# Patient Record
Sex: Female | Born: 1990 | Race: White | Hispanic: No | Marital: Single | Smoking: Never smoker
Health system: Southern US, Community
[De-identification: ages and names within clinical notes are randomized; demographics above are authoritative.]

---

## 2012-08-16 ENCOUNTER — Other Ambulatory Visit (HOSPITAL_COMMUNITY)
Admission: RE | Admit: 2012-08-16 | Discharge: 2012-08-16 | Disposition: A | Discharge status: Home / Self Care | Payer: BC Managed Care – PPO | Source: Ambulatory Visit | Attending: Obstetrics and Gynecology | Admitting: Obstetrics and Gynecology

## 2012-08-16 DIAGNOSIS — Z01419 Encounter for gynecological examination (general) (routine) without abnormal findings: Secondary | ICD-10-CM | POA: Insufficient documentation

## 2012-08-16 DIAGNOSIS — Z113 Encounter for screening for infections with a predominantly sexual mode of transmission: Secondary | ICD-10-CM | POA: Insufficient documentation

## 2013-06-18 ENCOUNTER — Encounter (HOSPITAL_COMMUNITY): Payer: Self-pay | Admitting: Emergency Medicine

## 2013-06-18 ENCOUNTER — Emergency Department (INDEPENDENT_AMBULATORY_CARE_PROVIDER_SITE_OTHER): Payer: BC Managed Care – PPO

## 2013-06-18 ENCOUNTER — Emergency Department (HOSPITAL_COMMUNITY)
Admission: EM | Admit: 2013-06-18 | Discharge: 2013-06-18 | Disposition: A | Discharge status: Home / Self Care | Payer: BC Managed Care – PPO | Source: Home / Self Care | Attending: Family Medicine | Admitting: Family Medicine

## 2013-06-18 DIAGNOSIS — R071 Chest pain on breathing: Secondary | ICD-10-CM

## 2013-06-18 DIAGNOSIS — R0789 Other chest pain: Secondary | ICD-10-CM

## 2013-06-18 MED ORDER — DICLOFENAC POTASSIUM 50 MG PO TABS: 50.0000 mg | ORAL_TABLET | Freq: Three times a day (TID) | ORAL | Status: DC

## 2013-06-18 NOTE — ED Provider Notes (Signed)
CSN: [id number]     Arrival date & time 06/18/13  1048 History   First MD Initiated Contact with Patient 06/18/13 1059     Chief Complaint  Patient presents with  . Chest Pain    since saturday.    (Consider location/radiation/quality/duration/timing/severity/associated sxs/prior Treatment) Patient is a 22 y.o. female presenting with chest pain. The history is provided by the patient.  Chest Pain Pain location:  R chest Pain quality: sharp   Pain radiates to:  Does not radiate Pain radiates to the back: no   Pain severity:  Mild Onset quality:  Gradual Duration:  4 days Progression:  Unchanged Chronicity:  New Associated symptoms: dizziness and shortness of breath   Associated symptoms: no abdominal pain, no cough, no fever, no heartburn and no palpitations     History reviewed. No pertinent past medical history. History reviewed. No pertinent past surgical history. History reviewed. No pertinent family history. History  Substance Use Topics  . Smoking status: Never Smoker   . Smokeless tobacco: Not on file  . Alcohol Use: Yes   OB History   Grav Para Term Preterm Abortions TAB SAB Ect Mult Living                 Review of Systems  Constitutional: Negative.  Negative for fever.  HENT: Negative.   Respiratory: Positive for shortness of breath. Negative for cough.   Cardiovascular: Positive for chest pain. Negative for palpitations and leg swelling.  Gastrointestinal: Negative.  Negative for heartburn and abdominal pain.  Genitourinary: Negative.   Neurological: Positive for dizziness.    Allergies  Sulfa antibiotics  Home Medications   Current Outpatient Rx  Name  Route  Sig  Dispense  Refill  . diclofenac (CATAFLAM) 50 MG tablet   Oral   Take 1 tablet (50 mg total) by mouth 3 (three) times daily. For chest pain   20 tablet   0    BP 143/93  Pulse 84  Temp(Src) 98.5 F (36.9 C) (Oral)  Resp 16  SpO2 96%  LMP 06/17/2013 Physical Exam  Nursing note  and vitals reviewed. Constitutional: She is oriented to person, place, and time. She appears well-developed and well-nourished.  HENT:  Head: Normocephalic.  Right Ear: External ear normal.  Left Ear: External ear normal.  Mouth/Throat: Oropharynx is clear and moist.  Eyes: Pupils are equal, round, and reactive to light.  Neck: Normal range of motion. Neck supple.  Cardiovascular: Normal rate, regular rhythm, normal heart sounds and intact distal pulses.   Pulmonary/Chest: Effort normal and breath sounds normal. She has no wheezes. She exhibits tenderness.  Neurological: She is alert and oriented to person, place, and time.  Skin: Skin is warm and dry.    ED Course  Procedures (including critical care time) Labs Review Labs Reviewed - No data to display Imaging Review Dg Chest 2 View  06/18/2013   CLINICAL DATA:  Chest pain for 3 days  EXAM: CHEST  2 VIEW  COMPARISON:  None.  FINDINGS: The heart size and mediastinal contours are within normal limits. Both lungs are clear. The visualized skeletal structures are unremarkable.  IMPRESSION: No active cardiopulmonary disease.   Electronically Signed   By: Elige Ko   On: 06/18/2013 11:38    EKG Interpretation     Ventricular Rate:    PR Interval:    QRS Duration:   QT Interval:    QTC Calculation:   R Axis:     Text Interpretation:  MDM  X-rays reviewed and report per radiologist. ecg -wnl.     Linna Hoff, MD 06/18/13 [phone]

## 2013-06-18 NOTE — ED Notes (Signed)
C/o chest pain with chest congestion since Saturday.  Sharp right sided pain that started yesterday. Gradually getting worse.  Dizziness and sob.  Pt has used mucinex with no relief.  Denies any other symptoms.

## 2013-09-23 ENCOUNTER — Encounter (HOSPITAL_COMMUNITY): Payer: Self-pay | Admitting: Emergency Medicine

## 2013-09-23 ENCOUNTER — Emergency Department (HOSPITAL_COMMUNITY)
Admission: EM | Admit: 2013-09-23 | Discharge: 2013-09-23 | Disposition: A | Discharge status: Home / Self Care | Payer: BC Managed Care – PPO | Source: Home / Self Care | Attending: Emergency Medicine | Admitting: Emergency Medicine

## 2013-09-23 DIAGNOSIS — A088 Other specified intestinal infections: Secondary | ICD-10-CM

## 2013-09-23 DIAGNOSIS — E860 Dehydration: Secondary | ICD-10-CM

## 2013-09-23 DIAGNOSIS — A084 Viral intestinal infection, unspecified: Secondary | ICD-10-CM

## 2013-09-23 LAB — CBC WITH DIFFERENTIAL/PLATELET
Basophils Absolute: 0 10*3/uL (ref 0.0–0.1)
Basophils Relative: 0 % (ref 0–1)
EOS PCT: 0 % (ref 0–5)
Eosinophils Absolute: 0 10*3/uL (ref 0.0–0.7)
HCT: 41.6 % (ref 36.0–46.0)
Hemoglobin: 14.5 g/dL (ref 12.0–15.0)
LYMPHS PCT: 20 % (ref 12–46)
Lymphs Abs: 1.4 10*3/uL (ref 0.7–4.0)
MCH: 27.7 pg (ref 26.0–34.0)
MCHC: 34.9 g/dL (ref 30.0–36.0)
MCV: 79.4 fL (ref 78.0–100.0)
Monocytes Absolute: 0.4 10*3/uL (ref 0.1–1.0)
Monocytes Relative: 6 % (ref 3–12)
NEUTROS ABS: 5 10*3/uL (ref 1.7–7.7)
Neutrophils Relative %: 73 % (ref 43–77)
PLATELETS: 258 10*3/uL (ref 150–400)
RBC: 5.24 MIL/uL — AB (ref 3.87–5.11)
RDW: 13.6 % (ref 11.5–15.5)
WBC: 6.8 10*3/uL (ref 4.0–10.5)

## 2013-09-23 LAB — POCT I-STAT, CHEM 8
BUN: 13 mg/dL (ref 6–23)
CHLORIDE: 100 meq/L (ref 96–112)
CREATININE: 1 mg/dL (ref 0.50–1.10)
Calcium, Ion: 1.21 mmol/L (ref 1.12–1.23)
Glucose, Bld: 96 mg/dL (ref 70–99)
HCT: 47 % — ABNORMAL HIGH (ref 36.0–46.0)
Hemoglobin: 16 g/dL — ABNORMAL HIGH (ref 12.0–15.0)
Potassium: 3.9 mEq/L (ref 3.7–5.3)
SODIUM: 137 meq/L (ref 137–147)
TCO2: 23 mmol/L (ref 0–100)

## 2013-09-23 MED ORDER — HYDROMORPHONE HCL PF 1 MG/ML IJ SOLN
1.0000 mg | Freq: Once | INTRAMUSCULAR | Status: AC
  Administered 2013-09-23: 1 mg via INTRAVENOUS

## 2013-09-23 MED ORDER — SODIUM CHLORIDE 0.9 % IV SOLN: INTRAVENOUS | Status: DC

## 2013-09-23 MED ORDER — DIPHENOXYLATE-ATROPINE 2.5-0.025 MG PO TABS: 1.0000 | ORAL_TABLET | Freq: Four times a day (QID) | ORAL | Status: DC | PRN

## 2013-09-23 MED ORDER — ONDANSETRON HCL 4 MG/2ML IJ SOLN
4.0000 mg | Freq: Once | INTRAMUSCULAR | Status: AC
  Administered 2013-09-23: 4 mg via INTRAVENOUS

## 2013-09-23 MED ORDER — ONDANSETRON 8 MG PO TBDP: 8.0000 mg | ORAL_TABLET | Freq: Three times a day (TID) | ORAL | Status: DC | PRN

## 2013-09-23 MED ORDER — ONDANSETRON HCL 4 MG/2ML IJ SOLN
INTRAMUSCULAR | Status: AC
  Filled 2013-09-23: qty 2

## 2013-09-23 MED ORDER — HYDROMORPHONE HCL PF 1 MG/ML IJ SOLN
INTRAMUSCULAR | Status: AC
  Filled 2013-09-23: qty 1

## 2013-09-23 NOTE — ED Notes (Signed)
Pt triaged and assessed by provider.   Provider in before nurse. 

## 2013-09-23 NOTE — Discharge Instructions (Signed)

## 2013-09-23 NOTE — ED Provider Notes (Signed)
Chief Complaint   Chief Complaint  Patient presents with  . Nausea  . Emesis  . Diarrhea     History of Present Illness   Tiffany Buck is a 23 year old female who has had a three-day history of nausea, vomiting, and diarrhea. She had one episode of emesis Saturday, one episode yesterday, and nontender. She denies any hematemesis, coffee-ground emesis. She states her emesis has been somewhat green. She had 3-4 loose stools yesterday and as many as 10 today. She notes upper, epigastric abdominal pain rated 8/10 in intensity. She denies any fever or chills. She has had no specific sick exposures. Denies any suspicious ingestions or foreign travel.  Review of Systems   Other than as noted above, the patient denies any of the following symptoms: Systemic:  No fevers, chills, sweats, weight loss or gain, fatigue, or tiredness. ENT:  No nasal congestion, rhinorrhea, or sore throat. Lungs:  No cough, wheezing, or shortness of breath. Cardiac:  No chest pain, syncope, or presyncope. GI:  No abdominal pain, nausea, vomiting, anorexia, diarrhea, constipation, blood in stool or vomitus. GU:  No dysuria, frequency, or urgency.  PMFSH   Past medical history, family history, social history, meds, and allergies were reviewed.  She is allergic to sulfa. She takes birth control pills.  Physical Exam     Vital signs:  BP 133/86  Pulse 130  Temp(Src) 98.7 F (37.1 C) (Oral)  Resp 22  SpO2 98%  LMP 09/08/2013 Filed Vitals:   09/23/13 0830 Supine  09/23/13 0831 Sitting  09/23/13 0832 Standing   BP: 120/83 126/90 133/86  Pulse: 102 116 130  Temp: 98.7 F (37.1 C)    TempSrc: Oral    Resp: 22    SpO2: 98%      General:  Alert and oriented.  Appears in moderate distress.  Skin warm and dry.  Good skin turgor, brisk capillary refill. ENT:  No scleral icterus, moist mucous membranes, no oral lesions, pharynx clear. Lungs:  Breath sounds clear and equal bilaterally.  No wheezes, rales,  or rhonchi. Heart:  Rhythm regular, without extrasystoles.  No gallops or murmers. Abdomen:  Soft, flat, nondistended. No organomegaly or mass. Bowel sounds are hyperactive. There was tenderness to palpation in the epigastrium, periumbilical area, and left lower quadrant. No guarding or rebound. Skin: Clear, warm, and dry.  Good turgor.  Brisk capillary refill.  Labs   Results for orders placed during the hospital encounter of 09/23/13  CBC WITH DIFFERENTIAL      Result Value Ref Range   WBC 6.8  4.0 - 10.5 K/uL   RBC 5.24 (*) 3.87 - 5.11 MIL/uL   Hemoglobin 14.5  12.0 - 15.0 g/dL   HCT 16.1  09.6 - 04.5 %   MCV 79.4  78.0 - 100.0 fL   MCH 27.7  26.0 - 34.0 pg   MCHC 34.9  30.0 - 36.0 g/dL   RDW 40.9  81.1 - 91.4 %   Platelets 258  150 - 400 K/uL   Neutrophils Relative % 73  43 - 77 %   Neutro Abs 5.0  1.7 - 7.7 K/uL   Lymphocytes Relative 20  12 - 46 %   Lymphs Abs 1.4  0.7 - 4.0 K/uL   Monocytes Relative 6  3 - 12 %   Monocytes Absolute 0.4  0.1 - 1.0 K/uL   Eosinophils Relative 0  0 - 5 %   Eosinophils Absolute 0.0  0.0 - 0.7 K/uL   Basophils  Relative 0  0 - 1 %   Basophils Absolute 0.0  0.0 - 0.1 K/uL  POCT I-STAT, CHEM 8      Result Value Ref Range   Sodium 137  137 - 147 mEq/L   Potassium 3.9  3.7 - 5.3 mEq/L   Chloride 100  96 - 112 mEq/L   BUN 13  6 - 23 mg/dL   Creatinine, Ser 4.091.00  0.50 - 1.10 mg/dL   Glucose, Bld 96  70 - 99 mg/dL   Calcium, Ion 8.111.21  9.141.12 - 1.23 mmol/L   TCO2 23  0 - 100 mmol/L   Hemoglobin 16.0 (*) 12.0 - 15.0 g/dL   HCT 78.247.0 (*) 95.636.0 - 21.346.0 %     Course in Urgent Care Center   She was given normal saline 1 L IV, Zofran 4 mg IV, and Dilaudid 1 mg IV. Thereafter she felt considerably better. No further episodes of emesis while at the Urgent Care Center.   Assessment   The primary encounter diagnosis was Viral gastroenteritis. A diagnosis of Dehydration was also pertinent to this visit.  Plan   1.  Meds:  The following meds were  prescribed:   Discharge Medication List as of 09/23/2013 11:46 AM    START taking these medications   Details  diphenoxylate-atropine (LOMOTIL) 2.5-0.025 MG per tablet Take 1 tablet by mouth 4 (four) times daily as needed for diarrhea or loose stools., Starting 09/23/2013, Until Discontinued, Print    ondansetron (ZOFRAN ODT) 8 MG disintegrating tablet Take 1 tablet (8 mg total) by mouth every 8 (eight) hours as needed for nausea., Starting 09/23/2013, Until Discontinued, Normal        2.  Patient Education/Counseling:  The patient was given appropriate handouts, self care instructions, and instructed in symptomatic relief. The patient was told to stay on clear liquids for the remainder of the day, then advance to a B.R.A.T. diet starting tomorrow.   3.  Follow up:  The patient was told to follow up here if no better in 2 to 3 days, or sooner if becoming worse in any way, and given some red flag symptoms such as persistent vomitng, high fever, severe abdominal pain, or any GI bleeding which would prompt immediate return.         Reuben Likesavid C Cyann Venti, MD 09/23/13 [phone]

## 2013-09-26 ENCOUNTER — Emergency Department (HOSPITAL_COMMUNITY)
Admission: EM | Admit: 2013-09-26 | Discharge: 2013-09-26 | Disposition: A | Discharge status: Home / Self Care | Payer: BC Managed Care – PPO | Attending: Emergency Medicine | Admitting: Emergency Medicine

## 2013-09-26 ENCOUNTER — Emergency Department (HOSPITAL_COMMUNITY): Payer: BC Managed Care – PPO

## 2013-09-26 ENCOUNTER — Emergency Department (HOSPITAL_COMMUNITY)
Admission: EM | Admit: 2013-09-26 | Discharge: 2013-09-26 | Disposition: A | Discharge status: Nursing Home (Includes ICF) | Payer: BC Managed Care – PPO | Source: Home / Self Care | Attending: Emergency Medicine | Admitting: Emergency Medicine

## 2013-09-26 ENCOUNTER — Encounter (HOSPITAL_COMMUNITY): Payer: Self-pay | Admitting: Emergency Medicine

## 2013-09-26 DIAGNOSIS — R1011 Right upper quadrant pain: Secondary | ICD-10-CM | POA: Insufficient documentation

## 2013-09-26 DIAGNOSIS — R110 Nausea: Secondary | ICD-10-CM | POA: Insufficient documentation

## 2013-09-26 DIAGNOSIS — J181 Lobar pneumonia, unspecified organism: Secondary | ICD-10-CM

## 2013-09-26 DIAGNOSIS — M25519 Pain in unspecified shoulder: Secondary | ICD-10-CM | POA: Insufficient documentation

## 2013-09-26 DIAGNOSIS — M7918 Myalgia, other site: Secondary | ICD-10-CM

## 2013-09-26 DIAGNOSIS — R109 Unspecified abdominal pain: Secondary | ICD-10-CM

## 2013-09-26 DIAGNOSIS — J159 Unspecified bacterial pneumonia: Secondary | ICD-10-CM | POA: Insufficient documentation

## 2013-09-26 DIAGNOSIS — R000 Tachycardia, unspecified: Secondary | ICD-10-CM | POA: Insufficient documentation

## 2013-09-26 DIAGNOSIS — IMO0001 Reserved for inherently not codable concepts without codable children: Secondary | ICD-10-CM | POA: Insufficient documentation

## 2013-09-26 DIAGNOSIS — Z79899 Other long term (current) drug therapy: Secondary | ICD-10-CM | POA: Insufficient documentation

## 2013-09-26 DIAGNOSIS — J189 Pneumonia, unspecified organism: Secondary | ICD-10-CM

## 2013-09-26 DIAGNOSIS — Z792 Long term (current) use of antibiotics: Secondary | ICD-10-CM | POA: Insufficient documentation

## 2013-09-26 LAB — POCT URINALYSIS DIP (DEVICE)
Glucose, UA: NEGATIVE mg/dL
Ketones, ur: NEGATIVE mg/dL
Leukocytes, UA: NEGATIVE
Nitrite: NEGATIVE
PH: 6 (ref 5.0–8.0)
Protein, ur: 100 mg/dL — AB
Urobilinogen, UA: 0.2 mg/dL (ref 0.0–1.0)

## 2013-09-26 LAB — COMPREHENSIVE METABOLIC PANEL
ALT: 29 U/L (ref 0–35)
AST: 23 U/L (ref 0–37)
Albumin: 3 g/dL — ABNORMAL LOW (ref 3.5–5.2)
Alkaline Phosphatase: 73 U/L (ref 39–117)
BILIRUBIN TOTAL: 0.4 mg/dL (ref 0.3–1.2)
BUN: 5 mg/dL — ABNORMAL LOW (ref 6–23)
CHLORIDE: 103 meq/L (ref 96–112)
CO2: 22 meq/L (ref 19–32)
CREATININE: 0.96 mg/dL (ref 0.50–1.10)
Calcium: 8.7 mg/dL (ref 8.4–10.5)
GFR calc Af Amer: >90 mL/min (ref >90)
GFR, EST NON AFRICAN AMERICAN: 83 mL/min — AB (ref >90)
Glucose, Bld: 90 mg/dL (ref 70–99)
Potassium: 3.6 mEq/L — ABNORMAL LOW (ref 3.7–5.3)
SODIUM: 140 meq/L (ref 137–147)
Total Protein: 7.6 g/dL (ref 6.0–8.3)

## 2013-09-26 LAB — CBC WITH DIFFERENTIAL/PLATELET
Basophils Absolute: 0 10*3/uL (ref 0.0–0.1)
Basophils Relative: 0 % (ref 0–1)
EOS PCT: 0 % (ref 0–5)
Eosinophils Absolute: 0 10*3/uL (ref 0.0–0.7)
HCT: 36.6 % (ref 36.0–46.0)
HEMOGLOBIN: 12.8 g/dL (ref 12.0–15.0)
LYMPHS ABS: 2 10*3/uL (ref 0.7–4.0)
Lymphocytes Relative: 18 % (ref 12–46)
MCH: 27.4 pg (ref 26.0–34.0)
MCHC: 35 g/dL (ref 30.0–36.0)
MCV: 78.2 fL (ref 78.0–100.0)
MONO ABS: 1 10*3/uL (ref 0.1–1.0)
Monocytes Relative: 9 % (ref 3–12)
NEUTROS ABS: 7.6 10*3/uL (ref 1.7–7.7)
Neutrophils Relative %: 72 % (ref 43–77)
Platelets: 242 10*3/uL (ref 150–400)
RBC: 4.68 MIL/uL (ref 3.87–5.11)
RDW: 13.4 % (ref 11.5–15.5)
WBC: 10.6 10*3/uL — AB (ref 4.0–10.5)

## 2013-09-26 LAB — POCT PREGNANCY, URINE: Preg Test, Ur: NEGATIVE

## 2013-09-26 LAB — LIPASE, BLOOD: Lipase: 51 U/L (ref 11–59)

## 2013-09-26 LAB — HCG, SERUM, QUALITATIVE: PREG SERUM: NEGATIVE

## 2013-09-26 LAB — D-DIMER, QUANTITATIVE (NOT AT ARMC): D-Dimer, Quant: 1.08 ug/mL-FEU — ABNORMAL HIGH (ref 0.00–0.48)

## 2013-09-26 MED ORDER — SODIUM CHLORIDE 0.9 % IV BOLUS (SEPSIS)
1000.0000 mL | Freq: Once | INTRAVENOUS | Status: AC
Start: 2013-09-26 — End: 2013-09-26
  Administered 2013-09-26: 1000 mL via INTRAVENOUS

## 2013-09-26 MED ORDER — HYDROMORPHONE HCL PF 1 MG/ML IJ SOLN
2.0000 mg | Freq: Once | INTRAMUSCULAR | Status: AC
  Administered 2013-09-26: 2 mg via INTRAMUSCULAR

## 2013-09-26 MED ORDER — IOHEXOL 350 MG/ML SOLN
100.0000 mL | Freq: Once | INTRAVENOUS | Status: AC | PRN
  Administered 2013-09-26: 70 mL via INTRAVENOUS

## 2013-09-26 MED ORDER — CYCLOBENZAPRINE HCL 10 MG PO TABS: 10.0000 mg | ORAL_TABLET | Freq: Two times a day (BID) | ORAL | Status: DC | PRN

## 2013-09-26 MED ORDER — HYDROMORPHONE HCL PF 1 MG/ML IJ SOLN
INTRAMUSCULAR | Status: AC
  Filled 2013-09-26: qty 2

## 2013-09-26 MED ORDER — HYDROMORPHONE HCL PF 1 MG/ML IJ SOLN
1.0000 mg | Freq: Once | INTRAMUSCULAR | Status: AC
  Administered 2013-09-26: 1 mg via INTRAVENOUS
  Filled 2013-09-26: qty 1

## 2013-09-26 MED ORDER — AZITHROMYCIN 250 MG PO TABS: 250.0000 mg | ORAL_TABLET | Freq: Every day | ORAL | Status: DC

## 2013-09-26 MED ORDER — HYDROMORPHONE HCL PF 1 MG/ML IJ SOLN
INTRAMUSCULAR | Status: AC
  Filled 2013-09-26: qty 1

## 2013-09-26 MED ORDER — ONDANSETRON HCL 4 MG/2ML IJ SOLN
4.0000 mg | Freq: Once | INTRAMUSCULAR | Status: AC
  Administered 2013-09-26: 4 mg via INTRAMUSCULAR

## 2013-09-26 MED ORDER — KETOROLAC TROMETHAMINE 30 MG/ML IJ SOLN
30.0000 mg | Freq: Once | INTRAMUSCULAR | Status: AC
  Administered 2013-09-26: 30 mg via INTRAVENOUS
  Filled 2013-09-26: qty 1

## 2013-09-26 MED ORDER — ONDANSETRON HCL 4 MG/2ML IJ SOLN
INTRAMUSCULAR | Status: AC
  Filled 2013-09-26: qty 2

## 2013-09-26 MED ORDER — ONDANSETRON HCL 4 MG/2ML IJ SOLN
4.0000 mg | Freq: Once | INTRAMUSCULAR | Status: AC
  Administered 2013-09-26: 4 mg via INTRAVENOUS
  Filled 2013-09-26: qty 2

## 2013-09-26 MED ORDER — IOHEXOL 350 MG/ML SOLN
100.0000 mL | Freq: Once | INTRAVENOUS | Status: AC | PRN
  Administered 2013-09-26: 63 mL via INTRAVENOUS

## 2013-09-26 NOTE — ED Notes (Signed)
Pt's IV noted to be not flushing well in CT with CT tech.  This RN attempted to fix IV with no success.  Pt c/o pain to IV site when flushing.  Pt allowing RN to use left arm.  No visible IV site noted.  Second RN to assess pt for IV access for CT scan.

## 2013-09-26 NOTE — ED Notes (Signed)
Unable to obtain D-Dimer after 5 sticks -- turned off IV per Duke SalviaLackey, PA's order-- will draw D-Dimer in 15 minutes.

## 2013-09-26 NOTE — ED Notes (Signed)
Spoke with mother- Cala BradfordKimberly -- [place]. Pt states it is ok to give update to mother

## 2013-09-26 NOTE — ED Notes (Signed)
[facility] with RIght side pain , right shoulder pain. Had N/V/D 2 days ago and [facility].Woke up with sharp stabbing pain. States right shoulder started hurting yesterday.

## 2013-09-26 NOTE — ED Provider Notes (Signed)
Pt signed out to me by Cristobal GoldmannJacob Lackey, PA-C at shift change. Plan is to review results of CT angio chest.  If negative, tx pain and nausea, f/u with PCP.  Discussed with Dr. Rolland PorterMark James, pain likely musculoskeletal.  Hx of same.  CT angio chest shows: no evidence of acute PE. Consolidation in lateral aspect of right middle lob is concerning for pneumonia.  Pt states she has not had a recent cough or sick contacts, however, due to no definitive source of pt's right flank pain, will tx with azithromycin.  Will also give pt flexeril and advise to take acetaminophen and ibuprofen as needed for pain.  Advised to f/u with PCP if symptoms not improving in 3 days. Return precautions provided. Pt verbalized understanding and agreement with tx plan.       Junius Finnerrin O'Malley, PA-C 09/26/13 2027

## 2013-09-26 NOTE — Discharge Instructions (Signed)
Be sure to complete all of your antibiotic. You may use flexeril to help ease right side pain. You may also take acetaminophen and ibuprofen as needed for pain.This medication can cause drowsiness. Do not drive or operate heavy machinery while taking.  Follow up with a primary care provider if symptoms not improving within next 3 days.  Return to ER if you develop NEW or worsening symptoms including chest pain, trouble breathing, or unable to keep down fluids.

## 2013-09-26 NOTE — Discharge Instructions (Signed)
We have determined that your problem requires further evaluation in the emergency department.  We will take care of your transport there.  Once at the emergency department, you will be evaluated by a provider and they will order whatever treatment or tests they deem necessary.  We cannot guarantee that they will do any specific test or do any specific treatment.  ° °

## 2013-09-26 NOTE — ED Notes (Signed)
Pt  Reports  Symptoms  Of  r  Side    And  r  Upper  Back  Pain         Pain is  Worse  On  Movement  And  posistion         Pt  Started  Last  Pm    Pt  Was  Seen   sev  Days  Ago  For  Gi  Symptoms       Was  Given iv  Fluids    And  meds

## 2013-09-26 NOTE — ED Provider Notes (Signed)
Chief Complaint   Chief Complaint  Patient presents with  . Back Pain    History of Present Illness   Tiffany Buck is a 23 year old female who was seen here 4 days ago with symptoms of gastroenteritis. Her lab work at that time was normal. She was given IV normal saline and felt better. She was sent home with Zofran and Lomotil. Her gastroenteritis symptoms resolved. Her diarrhea is gone away completely. She's had little ongoing nausea but no vomiting. Last night she had pain along her right trapezius ridge. At 4:30 this morning she was awakened by severe pain in the right flank and right CVA area. This hurts worse with deep inspiration, movement, and burping. She feels mildly short of breath. She's had no fever, chills, coughing, wheezing, vomiting, diarrhea, or urinary symptoms. Her last normal menstrual period was 2 weeks ago. She's had heavy bleeding since this morning. She is sexually active. She has no history of ulcer disease. She thinks she may have gallbladder disease but has never been conclusively proven. She has no history of DVT, thrombophlebitis, or embolism.  Review of Systems   Other than as noted above, the patient denies any of the following symptoms: Constitutional:  No fever, chills, weight loss or anorexia. Lungs:  No cough or shortness of breath. Heart:  No chest pain, palpitations, syncope or edema.  Abdomen:  No nausea, vomiting, hematememesis, melena, diarrhea, or hematochezia. GU:  No dysuria, frequency, urgency, or hematuria. Gyn:  No vaginal discharge, itching, abnormal bleeding, dyspareunia, or pelvic pain.  PMFSH   Past medical history, family history, social history, meds, and allergies were reviewed. She is allergic to sulfa. She takes birth control pills.  Physical Exam     Vital signs:  BP 158/88  Pulse 110  Temp(Src) 98.6 F (37 C) (Oral)  Resp 20  SpO2 100%  LMP 09/08/2013 Gen:  Alert, oriented, in moderate distress due to pain. She is taking  shallow breaths but is not in any respiratory distress. Lungs:  Breath sounds clear and equal bilaterally.  No wheezes, rales or rhonchi. Heart:  Regular rhythm.  No gallops or murmers.   Abdomen:  There is no CVA tenderness. Abdominal exam reveals pain to palpation in the right upper quadrant with guarding. Murphy sign is positive. Murphy's punch is negative. Bowel sounds are normally active. Skin:  Clear, warm and dry.  No rash.   Course in Urgent Care Center   A urinalysis and urine pregnancy test are being obtained. For her pain she was given Dilaudid 2 mg IM and Zofran 4 mg IM.   Assessment   The encounter diagnosis was Right upper quadrant pain.  Differential diagnosis includes cholecystitis, diaphragmatic irritation from pulmonary embolism, or ectopic pregnancy with intraperitoneal bleeding. She'll need further workup.  Plan     The patient was transferred to the ED via shuttle in stable condition.  Medical Decision Making   The patient is a 23 year old female who was seen here 4 days ago with symptoms of viral gastroenteritis with nausea, vomiting, and diarrhea. She was treated with IV normal saline and got better. This morning around 4:30 AM she was awakened by pain in the right flank and right back area which was worse with deep inspiration, moving, and burping. She felt mildly short of breath. She denies any fever, vomiting, or diarrhea. She has a little bit nauseated. She's never had anything like this before, although she does have a history of possible gallbladder disease. She denies any leg  pain, swelling, or history of DVT or pulmonary embolus. We are obtaining a urinalysis and pregnancy test, and given Dilaudid 2 mg IM and Zofran 4 mg IM, and was sent her by shuttle. Differential diagnosis includes gallbladder disease, pulmonary embolism, or ectopic pregnancy with intraperitoneal bleed.     Reuben Likes, MD 09/26/13 [phone]

## 2013-09-26 NOTE — ED Provider Notes (Signed)
CSN: [id number]     Arrival date & time 09/26/13  9629 History   First MD Initiated Contact with Patient 09/26/13 0930     Chief Complaint  Patient presents with  . Abdominal Pain     (Consider location/radiation/quality/duration/timing/severity/associated sxs/prior Treatment) HPI 23 yo female presents with Right sided flank pain that started 430 am this morning. Patient states she woke up and rolled over to her RIGHT side and felt a sharp pain in the right flank, rated at 9/10. Patient currently describes a constant dull pain that is rated at 4/10 after dilaudid injection at UC earlier today. Patient denies radiation of pain. Pain improved with standing up and worsened with lying down. Pain worse with deep inspiration. Admits to nausea. Denies vomiting. Denies fever/chills, CP, or SOB. Denies any hematuria, dysuria, or other urinary symptoms. Patient states she had toast with butter at 6:30 last night. Admits to RIGHT posterior shoulder pain starting at 8 last night. "felt like a muscle pain".  Patient currently menstruating. Patient currently on OCP. States she has started to have vaginal bleeding that started this Monday with lower abdominal cramping. Last admits to intercourse last Saturday.   Age > 9 yo: No HR > 100 bpm: Yes O2 sat on RA < 95%: No Prior hx of venous thromboembolism:No Trauma or surgery in past 4 wks:No Hemoptysis:No Exogenous Estrogen use:Yes Unilateral Leg swelling: No Pre tests probability for PE < 15%:No      History reviewed. No pertinent past medical history. History reviewed. No pertinent past surgical history. History reviewed. No pertinent family history. History  Substance Use Topics  . Smoking status: Never Smoker   . Smokeless tobacco: Not on file  . Alcohol Use: Yes   OB History   Grav Para Term Preterm Abortions TAB SAB Ect Mult Living                 Review of Systems  All other systems reviewed and are negative.      Allergies   Sulfa antibiotics  Home Medications   Current Outpatient Rx  Name  Route  Sig  Dispense  Refill  . diphenoxylate-atropine (LOMOTIL) 2.5-0.025 MG per tablet   Oral   Take 1 tablet by mouth 4 (four) times daily as needed for diarrhea or loose stools.   16 tablet   0   . norethindrone-ethinyl estradiol (JUNEL FE,GILDESS FE,LOESTRIN FE) 1-20 MG-MCG tablet   Oral   Take 1 tablet by mouth daily.         . ondansetron (ZOFRAN ODT) 8 MG disintegrating tablet   Oral   Take 1 tablet (8 mg total) by mouth every 8 (eight) hours as needed for nausea.   20 tablet   0   . azithromycin (ZITHROMAX) 250 MG tablet   Oral   Take 1 tablet (250 mg total) by mouth daily. Take first 2 tablets together, then 1 every day until finished.   6 tablet   0   . cyclobenzaprine (FLEXERIL) 10 MG tablet   Oral   Take 1 tablet (10 mg total) by mouth 2 (two) times daily as needed for muscle spasms.   20 tablet   0    BP 140/85  Pulse 103  Temp(Src) 98.9 F (37.2 C) (Oral)  Resp 18  SpO2 98%  LMP 09/08/2013 Physical Exam  Nursing note and vitals reviewed. Constitutional: She is oriented to person, place, and time. She appears well-developed and well-nourished. No distress.  HENT:  Head: Normocephalic and  atraumatic.  Eyes: Conjunctivae are normal. No scleral icterus.  Neck: No JVD present. No tracheal deviation present.  Cardiovascular: Normal rate and regular rhythm.  Exam reveals no gallop and no friction rub.   No murmur heard. Pulmonary/Chest: Effort normal and breath sounds normal. No respiratory distress. She has no wheezes. She has no rhonchi. She has no rales.  Abdominal: Soft. Bowel sounds are normal. She exhibits no distension. There is no hepatosplenomegaly. There is tenderness in the right upper quadrant. There is positive Murphy's sign. There is no rigidity, no rebound, no guarding, no CVA tenderness and no tenderness at McBurney's point.  Musculoskeletal: Normal range of motion. She  exhibits no edema.  Neurological: She is alert and oriented to person, place, and time.  Skin: Skin is warm and dry. She is not diaphoretic.  Psychiatric: She has a normal mood and affect. Her behavior is normal.    ED Course  Procedures (including critical care time) Labs Review Labs Reviewed  CBC WITH DIFFERENTIAL - Abnormal; Notable for the following:    WBC 10.6 (*)    All other components within normal limits  COMPREHENSIVE METABOLIC PANEL - Abnormal; Notable for the following:    Potassium 3.6 (*)    BUN 5 (*)    Albumin 3.0 (*)    GFR calc non Af Amer 83 (*)    All other components within normal limits  D-DIMER, QUANTITATIVE - Abnormal; Notable for the following:    D-Dimer, Quant 1.08 (*)    All other components within normal limits  HCG, SERUM, QUALITATIVE  LIPASE, BLOOD   Imaging Review No results found.  EKG Interpretation   None       MDM   Final diagnoses:  Right flank pain  Right middle lobe pneumonia  Musculoskeletal pain    Patient tachycardic at presentation.  Patient afebrile RUQ US shows sludge in gallbladder. No gallstones present. No evidence of Cholecytsitis Abdominal US shows no evidence of hydronephrosis. UA shows hematuria. Patient currently on her period.  Leukocytosis borderline at 10.6 Lipase negative Serum HCG negative D-Dimer elevated at 1.08 CT Angio chest pending...   Patient signed over to Margarita RanaErin Omalley, PA-C at shift change. Disposition pending CT results.        Allen NorrisJacob Gray LeolaLackey, PA-C 10/01/13 [phone]

## 2013-09-26 NOTE — ED Notes (Signed)
CT notified of 2nd IV access.

## 2013-09-30 NOTE — ED Provider Notes (Signed)
Medical screening examination/treatment/procedure(s) were conducted as a shared visit with non-physician practitioner(s) and myself.  I personally evaluated the patient during the encounter.  EKG Interpretation   None       Pt examined.  Pain in area of tenderness to paplation.  Clear lungs.  Benign abdomen. Agree with plan/dispo.  Rolland PorterMark Fitzgerald Dunne, MD 09/30/13 [phone]

## 2013-10-06 NOTE — ED Provider Notes (Signed)
Medical screening examination/treatment/procedure(s) were conducted as a shared visit with non-physician practitioner(s) and myself.  I personally evaluated the patient during the encounter.   EKG Interpretation None      Please see my above noted. Patient examined. Pain is reproduced with palpation, and seems musculoskeletal. Studies are normal. Plan will be discharge.  Rolland PorterMark Melyssa Signor, MD 10/06/13 [phone]

## 2014-06-28 ENCOUNTER — Emergency Department (HOSPITAL_COMMUNITY)
Admission: EM | Admit: 2014-06-28 | Discharge: 2014-06-28 | Disposition: A | Discharge status: Home / Self Care | Payer: BC Managed Care – PPO | Source: Home / Self Care | Attending: Emergency Medicine | Admitting: Emergency Medicine

## 2014-06-28 ENCOUNTER — Encounter (HOSPITAL_COMMUNITY): Payer: Self-pay | Admitting: Emergency Medicine

## 2014-06-28 DIAGNOSIS — G43009 Migraine without aura, not intractable, without status migrainosus: Secondary | ICD-10-CM

## 2014-06-28 DIAGNOSIS — R519 Headache, unspecified: Secondary | ICD-10-CM

## 2014-06-28 DIAGNOSIS — R51 Headache: Secondary | ICD-10-CM

## 2014-06-28 DIAGNOSIS — J329 Chronic sinusitis, unspecified: Secondary | ICD-10-CM

## 2014-06-28 MED ORDER — LIDOCAINE HCL (PF) 1 % IJ SOLN
INTRAMUSCULAR | Status: AC
  Filled 2014-06-28: qty 5

## 2014-06-28 MED ORDER — CEFTRIAXONE SODIUM 1 G IJ SOLR
1.0000 g | Freq: Once | INTRAMUSCULAR | Status: AC
  Administered 2014-06-28: 1 g via INTRAMUSCULAR

## 2014-06-28 MED ORDER — PREDNISONE 10 MG PO TABS: ORAL_TABLET | ORAL | Status: DC

## 2014-06-28 MED ORDER — DIPHENHYDRAMINE HCL 50 MG/ML IJ SOLN
INTRAMUSCULAR | Status: AC
  Filled 2014-06-28: qty 1

## 2014-06-28 MED ORDER — METOCLOPRAMIDE HCL 5 MG/ML IJ SOLN
10.0000 mg | Freq: Once | INTRAMUSCULAR | Status: AC
  Administered 2014-06-28: 10 mg via INTRAMUSCULAR

## 2014-06-28 MED ORDER — METOCLOPRAMIDE HCL 5 MG/ML IJ SOLN
INTRAMUSCULAR | Status: AC
  Filled 2014-06-28: qty 2

## 2014-06-28 MED ORDER — AMOXICILLIN-POT CLAVULANATE 875-125 MG PO TABS: 1.0000 | ORAL_TABLET | Freq: Two times a day (BID) | ORAL | Status: DC

## 2014-06-28 MED ORDER — KETOROLAC TROMETHAMINE 60 MG/2ML IM SOLN
INTRAMUSCULAR | Status: AC
  Filled 2014-06-28: qty 2

## 2014-06-28 MED ORDER — CEFTRIAXONE SODIUM 1 G IJ SOLR
INTRAMUSCULAR | Status: AC
  Filled 2014-06-28: qty 10

## 2014-06-28 MED ORDER — DIPHENHYDRAMINE HCL 50 MG/ML IJ SOLN
25.0000 mg | Freq: Once | INTRAMUSCULAR | Status: AC
  Administered 2014-06-28: 25 mg via INTRAMUSCULAR

## 2014-06-28 MED ORDER — KETOROLAC TROMETHAMINE 60 MG/2ML IM SOLN
60.0000 mg | Freq: Once | INTRAMUSCULAR | Status: AC
  Administered 2014-06-28: 60 mg via INTRAMUSCULAR

## 2014-06-28 NOTE — ED Notes (Signed)
Pt C/O severe headache and light sensitivity, onset last night. States that pain has been increasing. Tx with OTC ibuprofen with no relief.  Alert, no signs of acute distress. Donnetta Hutching-YRoberson, CMA

## 2014-06-28 NOTE — ED Provider Notes (Signed)
CSN: [id number]     Arrival date & time 06/28/14  1525 History   First MD Initiated Contact with Patient 06/28/14 1641     Chief Complaint  Patient presents with  . Headache   (Consider location/radiation/quality/duration/timing/severity/associated sxs/prior Treatment) HPI           23 year old female presents complaining of a severe headache. This started last night. She has a headache along the right side of her head, it also radiates across the left side of her forehead will into her right occiput. It is constant, worse with any movement. It is associated with photophobia. She denies any trauma. She admits to pain in her top teeth, nasal congestion, and sneezing. No fever, chills, NVD, cough. No history of migraine headaches.  History reviewed. No pertinent past medical history. History reviewed. No pertinent past surgical history. No family history on file. History  Substance Use Topics  . Smoking status: Never Smoker   . Smokeless tobacco: Not on file  . Alcohol Use: Yes   OB History    No data available     Review of Systems  Constitutional: Negative for fever and chills.  HENT: Positive for congestion, dental problem and sinus pressure.   Eyes: Positive for photophobia.  Neurological: Positive for headaches.  All other systems reviewed and are negative.   Allergies  Sulfa antibiotics  Home Medications   Prior to Admission medications   Medication Sig Start Date End Date Taking? Authorizing Provider  norethindrone-ethinyl estradiol (JUNEL FE,GILDESS FE,LOESTRIN FE) 1-20 MG-MCG tablet Take 1 tablet by mouth daily.   Yes Historical Provider, MD  amoxicillin-clavulanate (AUGMENTIN) 875-125 MG per tablet Take 1 tablet by mouth every 12 (twelve) hours. 06/28/14   Adrian BlackwaterZachary H Lankford Gutzmer, PA-C  azithromycin (ZITHROMAX) 250 MG tablet Take 1 tablet (250 mg total) by mouth daily. Take first 2 tablets together, then 1 every day until finished. 09/26/13   Junius FinnerErin O'Malley, PA-C   cyclobenzaprine (FLEXERIL) 10 MG tablet Take 1 tablet (10 mg total) by mouth 2 (two) times daily as needed for muscle spasms. 09/26/13   Junius FinnerErin O'Malley, PA-C  diphenoxylate-atropine (LOMOTIL) 2.5-0.025 MG per tablet Take 1 tablet by mouth 4 (four) times daily as needed for diarrhea or loose stools. 09/23/13   Reuben Likesavid C Keller, MD  ondansetron (ZOFRAN ODT) 8 MG disintegrating tablet Take 1 tablet (8 mg total) by mouth every 8 (eight) hours as needed for nausea. 09/23/13   Reuben Likesavid C Keller, MD  predniSONE (DELTASONE) 10 MG tablet 4 tabs PO QD for 4 days; 3 tabs PO QD for 3 days; 2 tabs PO QD for 2 days; 1 tab PO QD for 1 day 06/28/14   Graylon GoodZachary H Renesmay Nesbitt, PA-C   BP 138/85 mmHg  Pulse 79  Temp(Src) 98.3 F (36.8 C) (Oral)  Resp 16  SpO2 98%  LMP 06/21/2014 Physical Exam  Constitutional: She is oriented to person, place, and time. Vital signs are normal. She appears well-developed and well-nourished. No distress.  HENT:  Head: Normocephalic and atraumatic.  Right Ear: External ear normal.  Left Ear: External ear normal.  Nose: Right sinus exhibits maxillary sinus tenderness and frontal sinus tenderness. Left sinus exhibits no maxillary sinus tenderness and no frontal sinus tenderness.  Mouth/Throat: Oropharynx is clear and moist. No oropharyngeal exudate.  Eyes: Conjunctivae are normal. Right eye exhibits no discharge. Left eye exhibits no discharge.  Neck: Normal range of motion. Neck supple.  Cardiovascular: Normal rate, regular rhythm and normal heart sounds.   Pulmonary/Chest: Effort normal  and breath sounds normal. No respiratory distress.  Neurological: She is alert and oriented to person, place, and time. She has normal strength and normal reflexes. No cranial nerve deficit or sensory deficit. She exhibits normal muscle tone. She displays a negative Romberg sign. She displays no seizure activity. Coordination and gait normal. GCS eye subscore is 4. GCS verbal subscore is 5. GCS motor subscore is 6.   Skin: Skin is warm and dry. No rash noted. She is not diaphoretic.  Psychiatric: She has a normal mood and affect. Judgment normal.  Nursing note and vitals reviewed.   ED Course  Procedures (including critical care time) Labs Review Labs Reviewed - No data to display  Imaging Review No results found.  I believe she is experiencing sinus headache which has precipitated a migraine type headache.  We'll give migraine cocktail as well as 1 g of ceftriaxone and reassess  MDM   1. Sinus headache   2. Other sinusitis   3. Migraine without aura and without status migrainosus, not intractable    She had significant improvement with migraine cocktail, headache is down to 1-2 out of 10 from 7 out of 10. I believe she is experiencing sinus infection is caused her migraine type headache. Prescribed prednisone to prevent recurrence and Augmentin for sinusitis. Advised Afrin nasal spray if the headache returns. Follow-up when necessary if no improvement or if worsening  Meds ordered this encounter  Medications  . ketorolac (TORADOL) injection 60 mg    Sig:   . metoCLOPramide (REGLAN) injection 10 mg    Sig:   . diphenhydrAMINE (BENADRYL) injection 25 mg    Sig:   . cefTRIAXone (ROCEPHIN) injection 1 g    Sig:   . predniSONE (DELTASONE) 10 MG tablet    Sig: 4 tabs PO QD for 4 days; 3 tabs PO QD for 3 days; 2 tabs PO QD for 2 days; 1 tab PO QD for 1 day    Dispense:  30 tablet    Refill:  0    Order Specific Question:  Supervising Provider    Answer:  Lorenz CoasterKELLER, DAVID C [id number]]  . amoxicillin-clavulanate (AUGMENTIN) 875-125 MG per tablet    Sig: Take 1 tablet by mouth every 12 (twelve) hours.    Dispense:  14 tablet    Refill:  0    Order Specific Question:  Supervising Provider    Answer:  Lorenz CoasterKELLER, DAVID C [[id number]]       Graylon GoodZachary H Zakiya Sporrer, PA-C 06/28/14 1738

## 2014-06-28 NOTE — ED Notes (Signed)
Pt reports she feels much better

## 2014-06-28 NOTE — Discharge Instructions (Signed)
Migraine Headache A migraine headache is an intense, throbbing pain on one or both sides of your head. A migraine can last for 30 minutes to several hours. CAUSES  The exact cause of a migraine headache is not always known. However, a migraine may be caused when nerves in the brain become irritated and release chemicals that cause inflammation. This causes pain. Certain things may also trigger migraines, such as:  Alcohol.  Smoking.  Stress.  Menstruation.  Aged cheeses.  Foods or drinks that contain nitrates, glutamate, aspartame, or tyramine.  Lack of sleep.  Chocolate.  Caffeine.  Hunger.  Physical exertion.  Fatigue.  Medicines used to treat chest pain (nitroglycerine), birth control pills, estrogen, and some blood pressure medicines. SIGNS AND SYMPTOMS  Pain on one or both sides of your head.  Pulsating or throbbing pain.  Severe pain that prevents daily activities.  Pain that is aggravated by any physical activity.  Nausea, vomiting, or both.  Dizziness.  Pain with exposure to bright lights, loud noises, or activity.  General sensitivity to bright lights, loud noises, or smells. Before you get a migraine, you may get warning signs that a migraine is coming (aura). An aura may include:  Seeing flashing lights.  Seeing bright spots, halos, or zigzag lines.  Having tunnel vision or blurred vision.  Having feelings of numbness or tingling.  Having trouble talking.  Having muscle weakness. DIAGNOSIS  A migraine headache is often diagnosed based on:  Symptoms.  Physical exam.  A CT scan or MRI of your head. These imaging tests cannot diagnose migraines, but they can help rule out other causes of headaches. TREATMENT Medicines may be given for pain and nausea. Medicines can also be given to help prevent recurrent migraines.  HOME CARE INSTRUCTIONS  Only take over-the-counter or prescription medicines for pain or discomfort as directed by  your health care provider. The use of long-term narcotics is not recommended.  Lie down in a dark, quiet room when you have a migraine.  Keep a journal to find out what may trigger your migraine headaches. For example, write down:  What you eat and drink.  How much sleep you get.  Any change to your diet or medicines.  Limit alcohol consumption.  Quit smoking if you smoke.  Get 7-9 hours of sleep, or as recommended by your health care provider.  Limit stress.  Keep lights dim if bright lights bother you and make your migraines worse. SEEK IMMEDIATE MEDICAL CARE IF:   Your migraine becomes severe.  You have a fever.  You have a stiff neck.  You have vision loss.  You have muscular weakness or loss of muscle control.  You start losing your balance or have trouble walking.  You feel faint or pass out.  You have severe symptoms that are different from your first symptoms. MAKE SURE YOU:   Understand these instructions.  Will watch your condition.  Will get help right away if you are not doing well or get worse. Document Released: 07/25/2005 Document Revised: 12/09/2013 Document Reviewed: 04/01/2013 [facility] Patient Information 2015 [place]. This information is not intended to replace advice given to you by your health care provider. Make sure you discuss any questions you have with your health care provider.  Sinus Headache A sinus headache is when your sinuses become clogged or swollen. Sinus headaches can range from mild to severe.  CAUSES A sinus headache can have different causes, such as:  Colds.  Sinus infections.  Allergies. SYMPTOMS  Symptoms of a sinus headache may vary and can include:  Headache.  Pain or pressure in the face.  Congested or runny nose.  Fever.  Inability to smell.  Pain in upper teeth. Weather changes can make symptoms worse. TREATMENT  The treatment of a sinus headache depends on the cause.  Sinus pain caused  by a sinus infection may be treated with antibiotic medicine.  Sinus pain caused by allergies may be helped by allergy medicines (antihistamines) and medicated nasal sprays.  Sinus pain caused by congestion may be helped by flushing the nose and sinuses with saline solution. HOME CARE INSTRUCTIONS   If antibiotics are prescribed, take them as directed. Finish them even if you start to feel better.  Only take over-the-counter or prescription medicines for pain, discomfort, or fever as directed by your caregiver.  If you have congestion, use a nasal spray to help reduce pressure. SEEK IMMEDIATE MEDICAL CARE IF:  You have a fever.  You have headaches more than once a week.  You have sensitivity to light or sound.  You have repeated nausea and vomiting.  You have vision problems.  You have sudden, severe pain in your face or head.  You have a seizure.  You are confused.  Your sinus headaches do not get better after treatment. Many people think they have a sinus headache when they actually have migraines or tension headaches. MAKE SURE YOU:   Understand these instructions.  Will watch your condition.  Will get help right away if you are not doing well or get worse. Document Released: 09/01/2004 Document Revised: 10/17/2011 Document Reviewed: 10/23/2010 [facility] Patient Information 2015 [place], [place]. This information is not intended to replace advice given to you by your health care provider. Make sure you discuss any questions you have with your health care provider.  Sinusitis Sinusitis is redness, soreness, and inflammation of the paranasal sinuses. Paranasal sinuses are air pockets within the bones of your face (beneath the eyes, the middle of the forehead, or above the eyes). In healthy paranasal sinuses, mucus is able to drain out, and air is able to circulate through them by way of your nose. However, when your paranasal sinuses are inflamed, mucus and air can become  trapped. This can allow bacteria and other germs to grow and cause infection. Sinusitis can develop quickly and last only a short time (acute) or continue over a long period (chronic). Sinusitis that lasts for more than 12 weeks is considered chronic.  CAUSES  Causes of sinusitis include:  Allergies.  Structural abnormalities, such as displacement of the cartilage that separates your nostrils (deviated septum), which can decrease the air flow through your nose and sinuses and affect sinus drainage.  Functional abnormalities, such as when the small hairs (cilia) that line your sinuses and help remove mucus do not work properly or are not present. SIGNS AND SYMPTOMS  Symptoms of acute and chronic sinusitis are the same. The primary symptoms are pain and pressure around the affected sinuses. Other symptoms include:  Upper toothache.  Earache.  Headache.  Bad breath.  Decreased sense of smell and taste.  A cough, which worsens when you are lying flat.  Fatigue.  Fever.  Thick drainage from your nose, which often is green and may contain pus (purulent).  Swelling and warmth over the affected sinuses. DIAGNOSIS  Your health care provider will perform a physical exam. During the exam, your health care provider may:  Look in your nose for signs  of abnormal growths in your nostrils (nasal polyps).  Tap over the affected sinus to check for signs of infection.  View the inside of your sinuses (endoscopy) using an imaging device that has a light attached (endoscope). If your health care provider suspects that you have chronic sinusitis, one or more of the following tests may be recommended:  Allergy tests.  Nasal culture. A sample of mucus is taken from your nose, sent to a lab, and screened for bacteria.  Nasal cytology. A sample of mucus is taken from your nose and examined by your health care provider to determine if your sinusitis is related to an allergy. TREATMENT  Most  cases of acute sinusitis are related to a viral infection and will resolve on their own within 10 days. Sometimes medicines are prescribed to help relieve symptoms (pain medicine, decongestants, nasal steroid sprays, or saline sprays).  However, for sinusitis related to a bacterial infection, your health care provider will prescribe antibiotic medicines. These are medicines that will help kill the bacteria causing the infection.  Rarely, sinusitis is caused by a fungal infection. In theses cases, your health care provider will prescribe antifungal medicine. For some cases of chronic sinusitis, surgery is needed. Generally, these are cases in which sinusitis recurs more than 3 times per year, despite other treatments. HOME CARE INSTRUCTIONS   Drink plenty of water. Water helps thin the mucus so your sinuses can drain more easily.  Use a humidifier.  Inhale steam 3 to 4 times a day (for example, sit in the bathroom with the shower running).  Apply a warm, moist washcloth to your face 3 to 4 times a day, or as directed by your health care provider.  Use saline nasal sprays to help moisten and clean your sinuses.  Take medicines only as directed by your health care provider.  If you were prescribed either an antibiotic or antifungal medicine, finish it all even if you start to feel better. SEEK IMMEDIATE MEDICAL CARE IF:  You have increasing pain or severe headaches.  You have nausea, vomiting, or drowsiness.  You have swelling around your face.  You have vision problems.  You have a stiff neck.  You have difficulty breathing. MAKE SURE YOU:   Understand these instructions.  Will watch your condition.  Will get help right away if you are not doing well or get worse. Document Released: 07/25/2005 Document Revised: 12/09/2013 Document Reviewed: 08/09/2011 [facility] Patient Information 2015 [place], [place]. This information is not intended to replace advice given to you by your health  care provider. Make sure you discuss any questions you have with your health care provider.

## 2014-07-05 ENCOUNTER — Emergency Department (INDEPENDENT_AMBULATORY_CARE_PROVIDER_SITE_OTHER)
Admission: EM | Admit: 2014-07-05 | Discharge: 2014-07-05 | Disposition: A | Discharge status: Home / Self Care | Payer: BC Managed Care – PPO | Source: Home / Self Care | Attending: Family Medicine | Admitting: Family Medicine

## 2014-07-05 ENCOUNTER — Encounter (HOSPITAL_COMMUNITY): Payer: Self-pay | Admitting: Emergency Medicine

## 2014-07-05 DIAGNOSIS — S46811A Strain of other muscles, fascia and tendons at shoulder and upper arm level, right arm, initial encounter: Secondary | ICD-10-CM

## 2014-07-05 MED ORDER — TRAMADOL HCL 50 MG PO TABS
50.0000 mg | ORAL_TABLET | Freq: Four times a day (QID) | ORAL | Status: AC | PRN
Start: 2014-07-05 — End: ?

## 2014-07-05 MED ORDER — CYCLOBENZAPRINE HCL 5 MG PO TABS: 5.0000 mg | ORAL_TABLET | Freq: Every evening | ORAL | Status: AC | PRN

## 2014-07-05 NOTE — Discharge Instructions (Signed)
Thank you for coming in today. Follow up with PT and Dr. Katrinka BlazingSmith as needed.  Do not work or drive after taking tramadol or flexeril.   Come back or go to the emergency room if you notice new weakness new numbness problems walking or bowel or bladder problems.

## 2014-07-05 NOTE — ED Notes (Signed)
23 year old female with c/o right shoulder pain.

## 2014-07-05 NOTE — ED Provider Notes (Signed)
Tiffany Buck is a 23 y.o. female who presents to Urgent Care today for shoulder pain. Patient has right shoulder pain for 1 week worsening recently. She denies any injury but works at [employer]. Pain is worse with motion better with rest. No radiating pain weakness or numbness. She has tried over-the-counter medications which helped a bit. She is completing a prednisone course for sinusitis. No vomiting or diarrhea. No neck pain.   History reviewed. No pertinent past medical history. History reviewed. No pertinent past surgical history. History  Substance Use Topics  . Smoking status: Never Smoker   . Smokeless tobacco: Not on file  . Alcohol Use: Yes     Comment: very rarely   ROS as above Medications: No current facility-administered medications for this encounter.   Current Outpatient Prescriptions  Medication Sig Dispense Refill  . cyclobenzaprine (FLEXERIL) 5 MG tablet Take 1 tablet (5 mg total) by mouth at bedtime as needed for muscle spasms. 20 tablet 0  . diphenoxylate-atropine (LOMOTIL) 2.5-0.025 MG per tablet Take 1 tablet by mouth 4 (four) times daily as needed for diarrhea or loose stools. 16 tablet 0  . norethindrone-ethinyl estradiol (JUNEL FE,GILDESS FE,LOESTRIN FE) 1-20 MG-MCG tablet Take 1 tablet by mouth daily.    . ondansetron (ZOFRAN ODT) 8 MG disintegrating tablet Take 1 tablet (8 mg total) by mouth every 8 (eight) hours as needed for nausea. 20 tablet 0  . predniSONE (DELTASONE) 10 MG tablet 4 tabs PO QD for 4 days; 3 tabs PO QD for 3 days; 2 tabs PO QD for 2 days; 1 tab PO QD for 1 day 30 tablet 0  . traMADol (ULTRAM) 50 MG tablet Take 1 tablet (50 mg total) by mouth every 6 (six) hours as needed. 15 tablet 0   Allergies  Allergen Reactions  . Sulfa Antibiotics      Exam:  BP 116/89 mmHg  Pulse 92  Temp(Src) 98.7 F (37.1 C) (Oral)  Resp 20  SpO2 97%  LMP 06/21/2014 Gen: Well NAD Right shoulder: Normal-appearing. Tender palpation right trapezius. Normal  arm range of motion without significant pain. Negative impingement testing. Normal strength throughout. Sensation and grip strength and pulses intact distally bilateral upper extremities. Back is nontender to spinal midline normal neck range of motion.  No results found for this or any previous visit (from the past 24 hour(s)). No results found.  Assessment and Plan: 23 y.o. female with right trapezius strain. Physical therapy Flexeril tramadol follow up with sports medicine as needed. Differential includes first rib dysfunction as well.  Discussed warning signs or symptoms. Please see discharge instructions. Patient expresses understanding.     Rodolph BongEvan S Shavon Ashmore, MD 07/05/14 1700

## 2014-07-14 ENCOUNTER — Ambulatory Visit: Payer: BC Managed Care – PPO | Attending: Family Medicine | Admitting: Physical Therapy

## 2014-07-14 DIAGNOSIS — M79621 Pain in right upper arm: Secondary | ICD-10-CM | POA: Diagnosis present

## 2014-07-14 DIAGNOSIS — R209 Unspecified disturbances of skin sensation: Secondary | ICD-10-CM

## 2014-07-14 DIAGNOSIS — M25521 Pain in right elbow: Secondary | ICD-10-CM

## 2014-07-14 DIAGNOSIS — M542 Cervicalgia: Secondary | ICD-10-CM

## 2014-07-14 DIAGNOSIS — R208 Other disturbances of skin sensation: Secondary | ICD-10-CM | POA: Diagnosis not present

## 2014-07-14 NOTE — Therapy (Signed)
[facility] [address] [place], [place], [address] Phone: [phone]   Fax:  [phone]  Physical Therapy Evaluation  Patient Details  Name: Tiffany Buck MRN: [id number] Date of Birth: 09-May-1991  Encounter Date: 07/14/2014      PT End of Session - 07/14/14 1256    Visit Number 1   Number of Visits 12   Date for PT Re-Evaluation 08/25/14   PT Start Time 1150   PT Stop Time 1245   PT Time Calculation (min) 55 min   Activity Tolerance Patient limited by pain      No past medical history on file.  No past surgical history on file.  LMP 06/21/2014  Visit Diagnosis:  Pain in joint, upper arm, right  Sensory disturbance  Muscle pain, cervical      Subjective Assessment - 07/14/14 1200    Symptoms Woke 2 weeks ago with Rt sided neck/shoulder pain which she went to urgent care for.  The pain is persisiting, tingling in Rt. scapular pain at times.     Limitations Lifting;Writing;House hold activities;Other (comment)  gripping, carrying over 10 lbs.    How long can you sit comfortably? as needed   How long can you stand comfortably? as needed   How long can you walk comfortably? as needed   Diagnostic tests none   Patient Stated Goals to make it through a work day without pain/meds.    Currently in Pain? Yes   Pain Score 5    Pain Location Shoulder   Pain Orientation Right   Pain Descriptors / Indicators Dull;Aching;Sharp;Shooting  occ sharp and shooting   Pain Type Neuropathic pain;Acute pain   Pain Radiating Towards lower Rt. arm , gripping in forearm   Pain Onset 1 to 4 weeks ago   Pain Frequency Intermittent   Aggravating Factors  using Rt. UE   Pain Relieving Factors pain meds, heat and rest   Effect of Pain on Daily Activities has to take pain meds to get through the day   Multiple Pain Sites No          [facility] PT Assessment - 07/14/14 1205    Assessment   Medical Diagnosis trap strain    Sensation   Light  Touch --  hypersensitive    Additional Comments tingling down   AROM   Right Shoulder Flexion 125 Degrees   Right Shoulder ABduction 110 Degrees   Cervical Flexion WNL   Cervical - Right Side Bend WNL   Cervical - Left Side Bend WNL   Cervical - Right Rotation WNL   Cervical - Left Rotation WNL   Spurling's   Findings Negative   Distraction Test   Findngs Negative   Comment --  increased pain in scap.   Vertebral Artery Test    Findings Negative    Palpation: Did not tolerate palpation to middle and lower Rt. Sided cervicals, min pain in Rt. Upper trap.  Very painful in Rt. Levator and medial border of scapula, into latissimus.   Traction of neck produced pain in Rt. Scapula (burning, stabbing pain).  Tingling in digits 1-3.   Treated with IFC and ICE to Rt. Scapula.  15 min.        PT Education - 07/14/14 1256    Education provided Yes   Education Details PT/POC, diff diagnosis, ICE vs Heat and HEP, IFC   Person(s) Educated Patient   Methods Explanation;Demonstration;Verbal cues;Handout   Comprehension Returned demonstration;Need further instruction;Verbalized understanding  PT Short Term Goals - 07/14/14 1303    PT SHORT TERM GOAL #1   Title Pt. will be I with initial HEP   Time 2   Period Weeks   Status New   PT SHORT TERM GOAL #2   Title Pt will report 15-25% less pain in lower Rt. UE (radicular) with ADLs.    Time 2   Period Weeks   Status New   PT SHORT TERM GOAL #3   Title Pt. will be able to lift mod items in the home 10-15 lbs with good technique and no pain increase.    Time 2   Period Weeks   Status New          PT Long Term Goals - 07/14/14 1305    PT LONG TERM GOAL #1   Title Pt. will be able to return to work with min pain increase most days of the week without pain meds.    Time 6   Period Weeks   Status New   PT LONG TERM GOAL #2   Title Pt. will be able to report resolution  (75%) of radiating pain (scap, lower arm) to ease  work   Time 6   Period Weeks   Status New   PT LONG TERM GOAL #3   Title Pt. will be able to demo 5/5 with all MMT and no pain increase   Time 6   Period Weeks   Status New   PT LONG TERM GOAL #4   Title Pt. will be I with advanced HEP for posture and stabilization   Time 6   Period Weeks   Status New          Plan - 07/14/14 1257    Clinical Impression Statement Pt. presents with continued pain in Rt. side of neck and into Rt. UE.  Symptoms consistent with either severe trigger point in cervicals,upper back OR cervical radiculopathy.     Pt will benefit from skilled therapeutic intervention in order to improve on the following deficits Decreased range of motion;Impaired flexibility;Improper body mechanics;Postural dysfunction;Impaired sensation;Decreased activity tolerance;Obesity;Increased muscle spasms;Impaired UE functional use;Pain;Decreased mobility   Rehab Potential Excellent   PT Frequency 2x / week   PT Duration 6 weeks   PT Treatment/Interventions Moist Heat;Therapeutic activities;Patient/family education;Therapeutic exercise;Traction;Ultrasound;Manual techniques;Dry needling;Neuromuscular re-education;Cryotherapy;Electrical Stimulation;Functional mobility training   PT Next Visit Plan check HEP and try manual, IFC if she liked it. Check grip.    PT Home Exercise Plan upper trap, levator scap, scap. retraction   Consulted and Agree with Plan of Care Patient                              Problem List There are no active problems to display for this patient.   PAA,JENNIFER 07/14/2014, 1:11 PM

## 2014-07-14 NOTE — Patient Instructions (Signed)
  Flexibility: Upper Trapezius Stretch   Gently grasp right side of head while reaching behind back with other hand. Tilt head away until a gentle stretch is felt. Hold ___30_ seconds. Repeat __2-3_ times per set. Do ____1 sets per session. Do __2__ sessions per day.  http://orth.exer.us/340   Levator Stretch   Grasp seat or sit on hand on side to be stretched. Turn head toward other side and look down. Use hand on head to gently stretch neck in that position. Hold ___30 seconds. Repeat on other side. Repeat ___2-3_ times. Do __2__ sessions per day.  http://[email or web address].exer.us/30   Scapular Retraction (Standing)   With arms at sides, pinch shoulder blades together. Repeat _10-20___ times per set. Do ___1_ sets per session. Do ___2_ sessions per day.  http://orth.exer.us/944   Flexibility: Neck Retraction   Pull head straight back, keeping eyes and jaw level. Repeat __5-10__ times per set. Do __1__ sets per session. Do __2__ sessions per day.  http://orth.exer.us/344   Posture - Sitting   Sit upright, head facing forward. Try using a roll to support lower back. Keep shoulders relaxed, and avoid rounded back. Keep hips level with knees. Avoid crossing legs for long periods.

## 2014-07-21 ENCOUNTER — Ambulatory Visit: Payer: BC Managed Care – PPO | Admitting: Physical Therapy

## 2014-07-21 DIAGNOSIS — R209 Unspecified disturbances of skin sensation: Secondary | ICD-10-CM

## 2014-07-21 DIAGNOSIS — M542 Cervicalgia: Secondary | ICD-10-CM

## 2014-07-21 DIAGNOSIS — M25521 Pain in right elbow: Secondary | ICD-10-CM

## 2014-07-21 DIAGNOSIS — M79621 Pain in right upper arm: Secondary | ICD-10-CM | POA: Diagnosis not present

## 2014-07-21 NOTE — Patient Instructions (Signed)
Encouraged less retraction as she always feels tension there, asked her to include protraction which she understands.  Great job on neck stretches.

## 2014-07-21 NOTE — Therapy (Signed)
Outpatient Rehabilitation [facility] [address] [place], [place], [address] Phone: [phone]   Fax:  [phone]  Physical Therapy Treatment  Patient Details  Name: Tiffany Buck MRN: [id number] Date of Birth: 1991/05/20  Encounter Date: 07/21/2014      PT End of Session - 07/21/14 1229    Visit Number 2   Number of Visits 12   Date for PT Re-Evaluation 08/25/14   PT Start Time 0881   PT Stop Time 1238   PT Time Calculation (min) 61 min   Activity Tolerance Patient tolerated treatment well;Other (comment)  hypersensitive      No past medical history on file.  No past surgical history on file.  LMP 06/21/2014  Visit Diagnosis:  Pain in joint, upper arm, right  Sensory disturbance  Muscle pain, cervical      Subjective Assessment - 07/21/14 1142    Symptoms The nerve pain is gone.  Still feels senstive and painful in scapula. Worked Sat.   Currently in Pain? Yes   Pain Score 1    Pain Location Scapula   Pain Orientation Right          [facility] PT Assessment - 07/21/14 1146    AROM   Right Shoulder Flexion 146 Degrees          [facility] Adult PT Treatment/Exercise - 07/21/14 1146    Neck Exercises: Theraband   Scapula Retraction 10 reps  used ball under neck   Scapula Retraction Limitations protraction as well with hands clasped   Shoulder External Rotation 20 reps   Shoulder External Rotation Limitations yellow   Horizontal ABduction 10 reps   Horizontal ABduction Limitations --  yellow x 10    Neck Exercises: Seated   Shoulder Rolls 10 reps   Other Seated Exercise Reviewed HEP: levator and Upper trap stretch x 3 each   Shoulder Exercises: Pulleys   Flexion 2 minutes   ABduction 2 minutes   ABduction Limitations scaption   Modalities   Modalities Cryotherapy;Electrical Stimulation;Ultrasound   Cryotherapy   Number Minutes Cryotherapy --  15   Cryotherapy Location Shoulder  R scap   Type of Cryotherapy Ice pack   Electrical  Stimulation   Electrical Stimulation Location R scap   Electrical Stimulation Parameters to tolerance 16 V   Electrical Stimulation Goals Pain   Ultrasound   Ultrasound Location --  R medial border scap/Levator and rhomboid   Ultrasound Parameters 50%, 1 MHz   Ultrasound Goals Pain   Manual Therapy   Manual Therapy Scapular mobilization   Massage compression light to med scapular border   Scapular Mobilization gentle     Used biofreeze for [place]     PT Education - 07/21/14 1229    Education provided Yes   Education Details protraction, [place], nerve pain centralization   Person(s) Educated Patient   Methods Explanation   Comprehension Verbalized understanding          PT Short Term Goals - 07/21/14 1233    PT SHORT TERM GOAL #1   Title Pt. will be I with initial HEP   Status On-going   PT SHORT TERM GOAL #2   Title Pt will report 15-25% less pain in lower Rt. UE (radicular) with ADLs.   met but will monitor   Status On-going            Plan - 07/21/14 1230    Clinical Impression Statement Patient experiencing a centralization of pain to R scapula and along upper thoracic  spine. She is hypersensitive to touch but encouraged PT to continue. No goals met, 2nd visit. She reports tingling in scapula post treatment but no pain.    PT Next Visit Plan Check grip. Cont with modalities and gentle manual to R scap.  Add standing/wall ex for scap.    PT Home Exercise Plan continue current but de-emphasize scap. retraction, add protraction   Consulted and Agree with Plan of Care Patient       Raeford Razor, PT 07/21/2014 12:48 PM Phone: [phone] Fax: [phone]     Problem List There are no active problems to display for this patient.   PAA,JENNIFER 07/21/2014, 12:45 PM

## 2014-07-23 ENCOUNTER — Ambulatory Visit: Payer: BC Managed Care – PPO | Admitting: Rehabilitation

## 2014-07-23 DIAGNOSIS — M79621 Pain in right upper arm: Secondary | ICD-10-CM | POA: Diagnosis not present

## 2014-07-23 DIAGNOSIS — M542 Cervicalgia: Secondary | ICD-10-CM

## 2014-07-23 DIAGNOSIS — R209 Unspecified disturbances of skin sensation: Secondary | ICD-10-CM

## 2014-07-23 DIAGNOSIS — M25521 Pain in right elbow: Secondary | ICD-10-CM

## 2014-07-23 NOTE — Therapy (Signed)
[facility] [address] [place], [place], [address] Phone: [phone]   Fax:  [phone]  Physical Therapy Treatment  Patient Details  Name: Tiffany Buck MRN: [id number] Date of Birth: 03/25/1991  Encounter Date: 07/23/2014      PT End of Session - 07/23/14 1304    Visit Number 3   Number of Visits 12   Date for PT Re-Evaluation 08/25/14   PT Start Time 1145   PT Stop Time 1230   PT Time Calculation (min) 45 min      No past medical history on file.  No past surgical history on file.  LMP 06/21/2014  Visit Diagnosis:  Sensory disturbance  Muscle pain, cervical  Pain in joint, upper arm, right      Subjective Assessment - 07/23/14 1215    Symptoms The nerve pain is still gone except with overuse of Rt UE, increased pain and soreness after soft tissue work that lasted until yesterday. Hypersensitive to touch. 7/10            [facility] Adult PT Treatment/Exercise - 07/23/14 0001    Neck Exercises: Standing   Other Standing Exercises Radial, Ulnar and Medial nerve glides 5 sec x 3 each   Other Standing Exercises Protract/retract at wall, increase nerve pain with wrist extension, tried fists on wall, better.   Shoulder Exercises: Pulleys   Flexion 2 minutes   ABduction 2 minutes   Modalities   Modalities Cryotherapy;Electrical Stimulation;Ultrasound   Cryotherapy   Number Minutes Cryotherapy 15 Minutes   Cryotherapy Location Shoulder  right scapula   Type of Cryotherapy Ice pack   Electrical Stimulation   Electrical Stimulation Location R scap   Electrical Stimulation Action IFC   Electrical Stimulation Parameters to tolerance   Electrical Stimulation Goals Pain    CLINICAL IMPRESSION STATEMENT: Trial of nerve glides for HEP, hypersensitive to touch R scapula area  PLAN: check grip, continue IFC if helpful, assess benefit of nerve glides, did pt get appointment with MD or Otho?   Problem List There are no  active problems to display for this patient.   Sherrie MustacheDonoho, Kristena Wilhelmi McGee, PTA 07/23/2014, 1:12 PM

## 2014-07-28 ENCOUNTER — Encounter: Payer: Self-pay | Admitting: Physical Therapy

## 2014-07-28 ENCOUNTER — Ambulatory Visit: Payer: BC Managed Care – PPO | Admitting: Physical Therapy

## 2014-07-28 DIAGNOSIS — M79621 Pain in right upper arm: Secondary | ICD-10-CM | POA: Diagnosis not present

## 2014-07-28 DIAGNOSIS — R209 Unspecified disturbances of skin sensation: Secondary | ICD-10-CM

## 2014-07-28 DIAGNOSIS — M25521 Pain in right elbow: Secondary | ICD-10-CM

## 2014-07-28 DIAGNOSIS — M542 Cervicalgia: Secondary | ICD-10-CM

## 2014-07-28 NOTE — Therapy (Signed)
[facility] [address] [place], [place], [address] Phone: [phone]   Fax:  [phone]  Physical Therapy Treatment  Patient Details  Name: Tiffany Buck MRN: [id number] Date of Birth: 1991/01/28  Encounter Date: 07/28/2014      PT End of Session - 07/28/14 1216    Visit Number 4   Number of Visits 12   Date for PT Re-Evaluation 08/25/14   PT Start Time 1145      History reviewed. No pertinent past medical history.  History reviewed. No pertinent past surgical history.  LMP 06/21/2014  Visit Diagnosis:  Sensory disturbance  Muscle pain, cervical  Pain in joint, upper arm, right      Subjective Assessment - 07/28/14 1214    Symptoms Patient is having a good day, the nerve glides arent as painful now.  Pt. has ortho appt. Wed.  Min pain today in Rt. lateral neck.    Limitations Lifting;Writing;House hold activities;Other (comment)   How long can you sit comfortably? as needed   How long can you stand comfortably? as needed   How long can you walk comfortably? as needed   Diagnostic tests none   Patient Stated Goals to make it through a work day without pain/meds.    Currently in Pain? Yes   Pain Score 2    Pain Location Neck   Pain Orientation Right;Lateral;Posterior   Pain Descriptors / Indicators Tightness;Tingling;Aching   Pain Type Neuropathic pain   Pain Onset More than a month ago   Pain Frequency Intermittent   Aggravating Factors  working, using R UE, touching it   Pain Relieving Factors pain, ice/heat   Effect of Pain on Daily Activities can't work without pain   Multiple Pain Sites No          [facility] PT Assessment - 07/28/14 1152    AROM   Right Shoulder Flexion 160 Degrees  popping in neck   Right Shoulder ABduction 133 Degrees   Left Shoulder Flexion 160 Degrees   Left Shoulder ABduction 150 Degrees   Cervical Flexion 32   ext 40 relieves pain   Cervical - Right Side Bend 35   Cervical - Left Side Bend 45  pulls   Cervical - Right Rotation WNL  pulls    Cervical - Left Rotation --  inc tension in postlat neck   Strength   Grip (lbs) 43  L. 43 R 39     Self care for posture, disc/anatomy and options for POC       [facility] Adult PT Treatment/Exercise - 07/28/14 1232    Cryotherapy   Number Minutes Cryotherapy 15 Minutes   Cryotherapy Location Shoulder   Type of Cryotherapy Ice pack   Traction   Type of Traction Cervical   Min (lbs) 5   Max (lbs) 12   Hold Time 60   Rest Time 15   Time 15                PT Education - 07/28/14 1216    Education provided Yes   Education Details nerve glides, traction   Person(s) Educated Patient   Methods Explanation;Demonstration          PT Short Term Goals - 07/28/14 1226    PT SHORT TERM GOAL #1   Title Pt. will be I with initial HEP   Status On-going   PT SHORT TERM GOAL #2   Title Pt will report 15-25% less pain in lower Rt. UE (  radicular) with ADLs.   does not travel below prox shoulder at all    Status Achieved   PT SHORT TERM GOAL #3   Title Pt. will be able to lift mod items in the home 10-15 lbs with good technique and no pain increase.   can lift dog but if she holds too long, gets weaker   Status On-going           PT Long Term Goals - 07/28/14 1227    PT LONG TERM GOAL #1   Title Pt. will be able to return to work with min pain increase most days of the week without pain meds.    Status On-going   PT LONG TERM GOAL #2   Title Pt. will be able to report resolution  (75%) of radiating pain (scap, lower arm) to ease work  90% better   Status Achieved   PT LONG TERM GOAL #3   Title Pt. will be able to demo 5/5 with all MMT and no pain increase   Status On-going   PT LONG TERM GOAL #4   Title Pt. will be I with advanced HEP for posture and stabilization   Status On-going               Plan - 07/28/14 1218    Clinical Impression Statement Patient responding well to  "hands off" PT with gentle nerve glides, modailities.  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  Limited quite a bit with work activities .     Pt will benefit from skilled therapeutic intervention in order to improve on the following deficits Decreased range of motion;Impaired flexibility;Improper body mechanics;Postural dysfunction;Impaired sensation;Decreased activity tolerance;Obesity;Increased muscle spasms;Impaired UE functional use;Pain;Decreased mobility   Rehab Potential Excellent   PT Frequency 2x / week   PT Duration 6 weeks   PT Next Visit Plan cervical stab, see what MD said and repeat traction if favorable.    PT Home Exercise Plan cont with nerve glides, RICE   Consulted and Agree with Plan of Care Patient        Problem List There are no active problems to display for this patient.   Tiffany Buck 07/28/2014, 12:33 PM  [facility] [address] [place], [place], [address] Phone: [phone]   Fax:  [phone]

## 2014-07-30 ENCOUNTER — Other Ambulatory Visit: Payer: Self-pay | Admitting: Orthopedic Surgery

## 2014-07-30 ENCOUNTER — Ambulatory Visit: Payer: BC Managed Care – PPO | Admitting: Physical Therapy

## 2014-07-30 DIAGNOSIS — M542 Cervicalgia: Secondary | ICD-10-CM

## 2014-07-30 DIAGNOSIS — M79621 Pain in right upper arm: Secondary | ICD-10-CM | POA: Diagnosis not present

## 2014-07-30 DIAGNOSIS — R209 Unspecified disturbances of skin sensation: Secondary | ICD-10-CM

## 2014-07-30 DIAGNOSIS — M25521 Pain in right elbow: Secondary | ICD-10-CM

## 2014-07-30 NOTE — Therapy (Addendum)
[place], [place], [address] Phone: [phone]   Fax:  [phone]  Physical Therapy Treatment/Discharge  Patient Details  Name: Tiffany Buck MRN: [id number] Date of Birth: Oct 14, 1990  Encounter Date: 07/30/2014      PT End of Session - 07/30/14 1215    Visit Number 5   Number of Visits 12   Date for PT Re-Evaluation 08/25/14   PT Start Time 2876   PT Stop Time 1230   PT Time Calculation (min) 45 min   Activity Tolerance Patient limited by pain      No past medical history on file.  No past surgical history on file.  There were no vitals taken for this visit.  Visit Diagnosis:  Sensory disturbance  Muscle pain, cervical  Pain in joint, upper arm, right      Subjective Assessment - 07/30/14 1156    Symptoms Saw Dr. Lynann Bologna today, believes she may have a hernitated disc, having MRI Wed next week.  Traction made worse.    Limitations Lifting;Writing;House hold activities;Other (comment)   Currently in Pain? Yes   Pain Score 6    Pain Location Neck   Pain Orientation Right;Lateral;Posterior   Pain Descriptors / Indicators Aching;Tingling;Tightness   Pain Type Neuropathic pain;Chronic pain   Pain Radiating Towards prox shoulder   Pain Onset More than a month ago   Aggravating Factors  using R arm   Pain Relieving Factors ice, IFC   Multiple Pain Sites No                    [facility] Adult PT Treatment/Exercise - 07/30/14 1159    Neck Exercises: Theraband   Scapula Retraction 10 reps   Neck Exercises: Standing   Neck Retraction 10 reps;5 secs  slight rot R/L. x5 each 5 sec   Neck Exercises: Supine   Cervical Isometrics Extension;Right lateral flexion;Left lateral flexion;Right rotation;Left rotation;5 secs  with PT assist   Modalities   Modalities Cryotherapy;Electrical Stimulation   Cryotherapy   Number Minutes Cryotherapy 15 Minutes   Cryotherapy Location  Shoulder;Neck   Type of Cryotherapy Ice pack   Electrical Stimulation   Electrical Stimulation Location IFC   Electrical Stimulation Parameters to tolerance   Electrical Stimulation Goals Pain                  PT Short Term Goals - 07/28/14 1226    PT SHORT TERM GOAL #1   Title Pt. will be I with initial HEP   Status On-going   PT SHORT TERM GOAL #2   Title Pt will report 15-25% less pain in lower Rt. UE (radicular) with ADLs.   does not travel below prox shoulder at all    Status Achieved   PT SHORT TERM GOAL #3   Title Pt. will be able to lift mod items in the home 10-15 lbs with good technique and no pain increase.   can lift dog but if she holds too long, gets weaker   Status On-going           PT Long Term Goals - 07/28/14 1227    PT LONG TERM GOAL #1   Title Pt. will be able to return to work with min pain increase most days of the week without pain meds.    Status On-going   PT LONG TERM GOAL #2   Title Pt. will be able to report resolution  (75%) of radiating pain (scap, lower  arm) to ease work  90% better   Status Achieved   PT LONG TERM GOAL #3   Title Pt. will be able to demo 5/5 with all MMT and no pain increase   Status On-going   PT LONG TERM GOAL #4   Title Pt. will be I with advanced HEP for posture and stabilization   Status On-going               Plan - 07/30/14 1215    Clinical Impression Statement Patient would like to hold PT until MRI complete.  Encouraged her to continue to mind her posture, ice and F/U with mD for steriod med as it was helping previously.    PT Next Visit Plan re-assess   PT Home Exercise Plan cont with nerve glides, RICE   Consulted and Agree with Plan of Care Patient        Problem List There are no active problems to display for this patient.   Azarias Chiou 07/30/2014, 12:40 PM  [facility] [address] [place], [place], [address] Phone:  [phone]   Fax:  [phone]  Raeford Razor, PT 07/30/2014 12:41 PM Phone: [phone] Fax: [phone]  PHYSICAL THERAPY DISCHARGE SUMMARY  Visits from Start of Care: 5  Current functional level related to goals / functional outcomes: Unknown   Remaining deficits: Unknown   Education / Equipment: Posture anatomy, HEP Plan: Patient agrees to discharge.  Patient goals were not met. Patient is being discharged due to meeting the stated rehab goals.  ?????   Raeford Razor, PT 11/05/2015 2:56 PM Phone: [phone] Fax: [phone]

## 2014-08-06 ENCOUNTER — Ambulatory Visit
Admission: RE | Admit: 2014-08-06 | Discharge: 2014-08-06 | Disposition: A | Discharge status: Home / Self Care | Payer: BC Managed Care – PPO | Source: Ambulatory Visit | Attending: Orthopedic Surgery | Admitting: Orthopedic Surgery

## 2014-08-06 DIAGNOSIS — M542 Cervicalgia: Secondary | ICD-10-CM

## 2014-11-13 ENCOUNTER — Other Ambulatory Visit (HOSPITAL_COMMUNITY)
Admission: RE | Admit: 2014-11-13 | Discharge: 2014-11-13 | Disposition: A | Discharge status: Home / Self Care | Payer: BLUE CROSS/BLUE SHIELD | Source: Ambulatory Visit | Attending: Obstetrics and Gynecology | Admitting: Obstetrics and Gynecology

## 2014-11-13 ENCOUNTER — Other Ambulatory Visit: Payer: Self-pay | Admitting: Nurse Practitioner

## 2014-11-13 DIAGNOSIS — Z01419 Encounter for gynecological examination (general) (routine) without abnormal findings: Secondary | ICD-10-CM | POA: Insufficient documentation

## 2014-11-18 LAB — CYTOLOGY - PAP

## 2014-11-19 ENCOUNTER — Other Ambulatory Visit: Payer: Self-pay | Admitting: Orthopedic Surgery

## 2014-11-19 DIAGNOSIS — M542 Cervicalgia: Secondary | ICD-10-CM

## 2014-12-07 ENCOUNTER — Ambulatory Visit
Admission: RE | Admit: 2014-12-07 | Discharge: 2014-12-07 | Disposition: A | Discharge status: Home / Self Care | Payer: BLUE CROSS/BLUE SHIELD | Source: Ambulatory Visit | Attending: Orthopedic Surgery | Admitting: Orthopedic Surgery

## 2014-12-07 DIAGNOSIS — M542 Cervicalgia: Secondary | ICD-10-CM

## 2015-07-07 ENCOUNTER — Emergency Department (HOSPITAL_COMMUNITY)
Admission: EM | Admit: 2015-07-07 | Discharge: 2015-07-07 | Disposition: A | Discharge status: Home / Self Care | Payer: BLUE CROSS/BLUE SHIELD | Source: Home / Self Care | Attending: Emergency Medicine | Admitting: Emergency Medicine

## 2015-07-07 ENCOUNTER — Encounter (HOSPITAL_COMMUNITY): Payer: Self-pay | Admitting: Emergency Medicine

## 2015-07-07 DIAGNOSIS — R6889 Other general symptoms and signs: Secondary | ICD-10-CM

## 2015-07-07 MED ORDER — OSELTAMIVIR PHOSPHATE 75 MG PO CAPS
75.0000 mg | ORAL_CAPSULE | Freq: Two times a day (BID) | ORAL | Status: AC
Start: 2015-07-07 — End: ?

## 2015-07-07 NOTE — ED Provider Notes (Signed)
CSN: [id number]     Arrival date & time 07/07/15  1413 History   First MD Initiated Contact with Patient 07/07/15 1457     Chief Complaint  Patient presents with  . URI   (Consider location/radiation/quality/duration/timing/severity/associated sxs/prior Treatment) HPI  She is a 24 year old woman here for evaluation of upper respiratory symptoms. She states her symptoms started yesterday with a sore throat. She also reports nasal congestion, rhinorrhea, and a nonproductive cough. This morning, she developed fatigue. She reports subjective fevers, but has not taken her temperature. She also reports chills and some body aches. Reports loss of appetite. No nausea or vomiting. No shortness of breath. She does report chest pain with cough only. She has not had her flu shot yet.  She has been taking DayQuil and NyQuil with some improvement.  History reviewed. No pertinent past medical history. History reviewed. No pertinent past surgical history. No family history on file. Social History  Substance Use Topics  . Smoking status: Never Smoker   . Smokeless tobacco: None  . Alcohol Use: Yes     Comment: very rarely   OB History    No data available     Review of Systems As in history of present illness Allergies  Sulfa antibiotics  Home Medications   Prior to Admission medications   Medication Sig Start Date End Date Taking? Authorizing Provider  cyclobenzaprine (FLEXERIL) 5 MG tablet Take 1 tablet (5 mg total) by mouth at bedtime as needed for muscle spasms. 07/05/14   Rodolph BongEvan S Corey, MD  norethindrone-ethinyl estradiol (JUNEL FE,GILDESS FE,LOESTRIN FE) 1-20 MG-MCG tablet Take 1 tablet by mouth daily.    Historical Provider, MD  oseltamivir (TAMIFLU) 75 MG capsule Take 1 capsule (75 mg total) by mouth every 12 (twelve) hours. 07/07/15   Charm RingsErin J Jodette Wik, MD  traMADol (ULTRAM) 50 MG tablet Take 1 tablet (50 mg total) by mouth every 6 (six) hours as needed. 07/05/14   Rodolph BongEvan S Corey, MD   Meds  Ordered and Administered this Visit  Medications - No data to display  BP 110/60 mmHg  Pulse 88  Temp(Src) 98.9 F (37.2 C) (Oral)  Resp 18  SpO2 100%  LMP 07/06/2015 No data found.   Physical Exam  Constitutional: She is oriented to person, place, and time. She appears well-developed and well-nourished. No distress.  Appears tired  HENT:  Nose: Nose normal.  Mouth/Throat: Oropharynx is clear and moist. No oropharyngeal exudate.  TMs normal bilaterally  Neck: Neck supple.  Cardiovascular: Normal rate, regular rhythm and normal heart sounds.   No murmur heard. Pulmonary/Chest: Effort normal and breath sounds normal. No respiratory distress. She has no wheezes. She has no rales.  Lymphadenopathy:    She has no cervical adenopathy.  Neurological: She is alert and oriented to person, place, and time.    ED Course  Procedures (including critical care time)  Labs Review Labs Reviewed - No data to display  Imaging Review No results found.   MDM   1. Flu-like symptoms    Given her symptoms started yesterday, will treat with Tamiflu. Discussed importance of fluids and rest. Okay to continue DayQuil and NyQuil as needed. Work note given. Return precautions reviewed.    Charm RingsErin J Jisele Price, MD 07/07/15 [phone]

## 2015-07-07 NOTE — ED Notes (Signed)
Pt comes in with dry cough, congestion, sore throat, chills and loss of appetite Denies n/v /or diarrhea Taking Dayquil/Nyquil with minimal relief

## 2015-07-07 NOTE — Discharge Instructions (Signed)
You likely have the flu. Take Tamiflu as prescribed. This will shorten the duration of symptoms. Make sure you are drinking plenty of fluids and getting plenty of rest. Continue DayQuil and NyQuil as needed. The worst of it should be over in the next 3 days. Follow-up as needed.

## 2015-11-19 ENCOUNTER — Encounter (HOSPITAL_COMMUNITY): Payer: Self-pay | Admitting: *Deleted

## 2015-11-19 ENCOUNTER — Ambulatory Visit (HOSPITAL_COMMUNITY)
Admission: EM | Admit: 2015-11-19 | Discharge: 2015-11-19 | Disposition: A | Discharge status: Home / Self Care | Payer: BLUE CROSS/BLUE SHIELD | Attending: Family Medicine | Admitting: Family Medicine

## 2015-11-19 DIAGNOSIS — Z79899 Other long term (current) drug therapy: Secondary | ICD-10-CM | POA: Insufficient documentation

## 2015-11-19 DIAGNOSIS — J029 Acute pharyngitis, unspecified: Secondary | ICD-10-CM | POA: Insufficient documentation

## 2015-11-19 DIAGNOSIS — J302 Other seasonal allergic rhinitis: Secondary | ICD-10-CM | POA: Diagnosis not present

## 2015-11-19 DIAGNOSIS — Z888 Allergy status to other drugs, medicaments and biological substances status: Secondary | ICD-10-CM | POA: Insufficient documentation

## 2015-11-19 LAB — POCT RAPID STREP A: STREPTOCOCCUS, GROUP A SCREEN (DIRECT): NEGATIVE

## 2015-11-19 NOTE — ED Provider Notes (Signed)
CSN: [id number]     Arrival date & time 11/19/15  1552 History   First MD Initiated Contact with Patient 11/19/15 1720     Chief Complaint  Patient presents with  . Sore Throat   (Consider location/radiation/quality/duration/timing/severity/associated sxs/prior Treatment) Patient is a 25 y.o. female presenting with pharyngitis. The history is provided by the patient.  Sore Throat This is a new problem. The current episode started more than 2 days ago (seasonal allergy cong and pnd.). The problem has been gradually worsening. Pertinent negatives include no chest pain and no abdominal pain. The symptoms are aggravated by swallowing.    History reviewed. No pertinent past medical history. History reviewed. No pertinent past surgical history. History reviewed. No pertinent family history. Social History  Substance Use Topics  . Smoking status: Never Smoker   . Smokeless tobacco: None  . Alcohol Use: Yes     Comment: very rarely   OB History    No data available     Review of Systems  Constitutional: Negative.  Negative for fever.  HENT: Positive for congestion, postnasal drip, rhinorrhea and sore throat.   Respiratory: Negative.   Cardiovascular: Negative.  Negative for chest pain.  Gastrointestinal: Negative for abdominal pain.  All other systems reviewed and are negative.   Allergies  Sulfa antibiotics  Home Medications   Prior to Admission medications   Medication Sig Start Date End Date Taking? Authorizing Provider  cyclobenzaprine (FLEXERIL) 5 MG tablet Take 1 tablet (5 mg total) by mouth at bedtime as needed for muscle spasms. 07/05/14   Rodolph BongEvan S Corey, MD  norethindrone-ethinyl estradiol (JUNEL FE,GILDESS FE,LOESTRIN FE) 1-20 MG-MCG tablet Take 1 tablet by mouth daily.    Historical Provider, MD  oseltamivir (TAMIFLU) 75 MG capsule Take 1 capsule (75 mg total) by mouth every 12 (twelve) hours. 07/07/15   Charm RingsErin J Honig, MD  traMADol (ULTRAM) 50 MG tablet Take 1 tablet (50  mg total) by mouth every 6 (six) hours as needed. 07/05/14   Rodolph BongEvan S Corey, MD   Meds Ordered and Administered this Visit  Medications - No data to display  BP 120/83 mmHg  Pulse 95  Temp(Src) 98.8 F (37.1 C) (Oral)  Resp 16  SpO2 100%  LMP 11/19/2015 No data found.   Physical Exam  Constitutional: She appears well-developed and well-nourished. No distress.  HENT:  Head: Normocephalic.  Right Ear: External ear normal.  Left Ear: External ear normal.  Mouth/Throat: Oropharynx is clear and moist.  Eyes: Pupils are equal, round, and reactive to light.  Neck: Normal range of motion. Neck supple.  Cardiovascular: Normal rate, regular rhythm, normal heart sounds and intact distal pulses.   Pulmonary/Chest: Effort normal and breath sounds normal.  Lymphadenopathy:    She has no cervical adenopathy.  Skin: Skin is warm and dry.  Nursing note and vitals reviewed.   ED Course  Procedures (including critical care time)  Labs Review Labs Reviewed  POCT RAPID STREP A    Imaging Review No results found.   Visual Acuity Review  Right Eye Distance:   Left Eye Distance:   Bilateral Distance:    Right Eye Near:   Left Eye Near:    Bilateral Near:         MDM   1. Seasonal allergic rhinitis        Linna HoffJames D Theodora Lalanne, MD 11/19/15 [phone]

## 2015-11-19 NOTE — ED Notes (Signed)
Pt  Reports  Symptoms  Of  sorethroat       / headache       With   Symptoms       X  3  Days           symptoms  Not  releived  By  otc  Meds  Pt sitting upright on the  Exam table speaking in complete  sentances

## 2015-11-22 LAB — CULTURE, GROUP A STREP (THRC)

## 2017-05-31 ENCOUNTER — Emergency Department (HOSPITAL_COMMUNITY)
Admission: EM | Admit: 2017-05-31 | Discharge: 2017-05-31 | Disposition: A | Discharge status: Home / Self Care | Payer: 59 | Attending: Emergency Medicine | Admitting: Emergency Medicine

## 2017-05-31 ENCOUNTER — Emergency Department (HOSPITAL_COMMUNITY): Payer: 59

## 2017-05-31 ENCOUNTER — Encounter (HOSPITAL_COMMUNITY): Payer: Self-pay | Admitting: *Deleted

## 2017-05-31 DIAGNOSIS — M79601 Pain in right arm: Secondary | ICD-10-CM | POA: Diagnosis present

## 2017-05-31 DIAGNOSIS — Z79899 Other long term (current) drug therapy: Secondary | ICD-10-CM | POA: Insufficient documentation

## 2017-05-31 DIAGNOSIS — M25521 Pain in right elbow: Secondary | ICD-10-CM | POA: Diagnosis not present

## 2017-05-31 LAB — CBC WITH DIFFERENTIAL/PLATELET
BASOS ABS: 0 10*3/uL (ref 0.0–0.1)
BASOS PCT: 0 %
EOS PCT: 1 %
Eosinophils Absolute: 0.1 10*3/uL (ref 0.0–0.7)
HCT: 40.4 % (ref 36.0–46.0)
Hemoglobin: 13.5 g/dL (ref 12.0–15.0)
Lymphocytes Relative: 21 %
Lymphs Abs: 2.6 10*3/uL (ref 0.7–4.0)
MCH: 27 pg (ref 26.0–34.0)
MCHC: 33.4 g/dL (ref 30.0–36.0)
MCV: 80.8 fL (ref 78.0–100.0)
MONO ABS: 0.8 10*3/uL (ref 0.1–1.0)
MONOS PCT: 7 %
Neutro Abs: 8.6 10*3/uL — ABNORMAL HIGH (ref 1.7–7.7)
Neutrophils Relative %: 71 %
PLATELETS: 263 10*3/uL (ref 150–400)
RBC: 5 MIL/uL (ref 3.87–5.11)
RDW: 14 % (ref 11.5–15.5)
WBC: 12.1 10*3/uL — ABNORMAL HIGH (ref 4.0–10.5)

## 2017-05-31 LAB — BASIC METABOLIC PANEL
Anion gap: 6 (ref 5–15)
BUN: 13 mg/dL (ref 6–20)
CALCIUM: 9 mg/dL (ref 8.9–10.3)
CO2: 27 mmol/L (ref 22–32)
Chloride: 104 mmol/L (ref 101–111)
Creatinine, Ser: 0.87 mg/dL (ref 0.44–1.00)
GFR calc Af Amer: >60 mL/min (ref >60)
GFR calc non Af Amer: >60 mL/min (ref >60)
GLUCOSE: 93 mg/dL (ref 65–99)
Potassium: 4 mmol/L (ref 3.5–5.1)
Sodium: 137 mmol/L (ref 135–145)

## 2017-05-31 LAB — CK: Total CK: 143 U/L (ref 38–234)

## 2017-05-31 LAB — C-REACTIVE PROTEIN: CRP: 4.6 mg/dL — ABNORMAL HIGH (ref <1.0)

## 2017-05-31 LAB — SEDIMENTATION RATE: SED RATE: 40 mm/h — AB (ref 0–22)

## 2017-05-31 LAB — SYNOVIAL CELL COUNT + DIFF, W/ CRYSTALS
Crystals, Fluid: NONE SEEN
Eosinophils-Synovial: 0 % (ref 0–1)
LYMPHOCYTES-SYNOVIAL FLD: 1 % (ref 0–20)
Monocyte-Macrophage-Synovial Fluid: 6 % — ABNORMAL LOW (ref 50–90)
NEUTROPHIL, SYNOVIAL: 93 % — AB (ref 0–25)
WBC, SYNOVIAL: 33000 /mm3 — AB (ref 0–200)

## 2017-05-31 LAB — I-STAT BETA HCG BLOOD, ED (MC, WL, AP ONLY): I-stat hCG, quantitative: <5 m[IU]/mL (ref <5)

## 2017-05-31 MED ORDER — LORAZEPAM 2 MG/ML IJ SOLN
1.0000 mg | Freq: Once | INTRAMUSCULAR | Status: AC
Start: 2017-05-31 — End: 2017-05-31
  Administered 2017-05-31: 1 mg via INTRAVENOUS
  Filled 2017-05-31: qty 1

## 2017-05-31 MED ORDER — IBUPROFEN 400 MG PO TABS
600.0000 mg | ORAL_TABLET | Freq: Once | ORAL | Status: AC
  Administered 2017-05-31: 600 mg via ORAL
  Filled 2017-05-31: qty 1

## 2017-05-31 MED ORDER — DICLOFENAC SODIUM 1 % TD GEL: 2.0000 g | Freq: Four times a day (QID) | TRANSDERMAL | 0 refills | Status: AC

## 2017-05-31 MED ORDER — IOPAMIDOL (ISOVUE-M 200) INJECTION 41%
INTRAMUSCULAR | Status: AC
  Filled 2017-05-31: qty 10

## 2017-05-31 MED ORDER — KETOROLAC TROMETHAMINE 30 MG/ML IJ SOLN
30.0000 mg | Freq: Once | INTRAMUSCULAR | Status: AC
  Administered 2017-05-31: 30 mg via INTRAVENOUS
  Filled 2017-05-31: qty 1

## 2017-05-31 MED ORDER — MORPHINE SULFATE (PF) 4 MG/ML IV SOLN
4.0000 mg | Freq: Once | INTRAVENOUS | Status: AC
  Administered 2017-05-31: 4 mg via INTRAVENOUS
  Filled 2017-05-31: qty 1

## 2017-05-31 MED ORDER — ACETAMINOPHEN 500 MG PO TABS
1000.0000 mg | ORAL_TABLET | Freq: Once | ORAL | Status: AC
  Administered 2017-05-31: 1000 mg via ORAL
  Filled 2017-05-31: qty 2

## 2017-05-31 MED ORDER — MELOXICAM 15 MG PO TABS: 15.0000 mg | ORAL_TABLET | Freq: Every day | ORAL | 0 refills | Status: AC

## 2017-05-31 NOTE — ED Triage Notes (Addendum)
Pt states increasing R forearm pain since Monday.  Denies injury.  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

## 2017-05-31 NOTE — ED Notes (Signed)
Patient transported to X-ray 

## 2017-05-31 NOTE — Consult Note (Signed)
Reason for Consult:Right elbow pain Referring Physician: Rosine Abe  Tiffany Buck is an 26 y.o. female.  HPI: Tiffany Buck comes to the ED with a 2d hx/o right elbow pain. She was in her usual state of health Sunday night when she went to bed. Monday morning she woke up with mild pain but thought she just slept on it wrong. It was worse Tuesday but she was still able to work. By today the pain was severe and she came to the ED. She denies any fevers, chills, sweats, N/V, hx/o trauma, RUE lesions, or hx/o gout or any similar e/o. She is RHD.  History reviewed. No pertinent past medical history.  History reviewed. No pertinent surgical history.  No family history on file.  Social History:  reports that she has never smoked. She has never used smokeless tobacco. She reports that she drinks alcohol. She reports that she does not use drugs.  Allergies:  Allergies  Allergen Reactions  . Sulfa Antibiotics     Medications: I have reviewed the patient's current medications.  Results for orders placed or performed during the hospital encounter of 05/31/17 (from the past 48 hour(s))  Basic metabolic panel     Status: None   Collection Time: 05/31/17 12:00 PM  Result Value Ref Range   Sodium 137 135 - 145 mmol/L   Potassium 4.0 3.5 - 5.1 mmol/L   Chloride 104 101 - 111 mmol/L   CO2 27 22 - 32 mmol/L   Glucose, Bld 93 65 - 99 mg/dL   BUN 13 6 - 20 mg/dL   Creatinine, Ser 0.87 0.44 - 1.00 mg/dL   Calcium 9.0 8.9 - 10.3 mg/dL   GFR calc non Af Amer >60 >60 mL/min   GFR calc Af Amer >60 >60 mL/min    Comment: (NOTE) The eGFR has been calculated using the CKD EPI equation. This calculation has not been validated in all clinical situations. eGFR's persistently <60 mL/min signify possible Chronic Kidney Disease.    Anion gap 6 5 - 15  CBC with Differential     Status: Abnormal   Collection Time: 05/31/17 12:00 PM  Result Value Ref Range   WBC 12.1 (H) 4.0 - 10.5 K/uL   RBC 5.00 3.87 - 5.11  MIL/uL   Hemoglobin 13.5 12.0 - 15.0 g/dL   HCT 40.4 36.0 - 46.0 %   MCV 80.8 78.0 - 100.0 fL   MCH 27.0 26.0 - 34.0 pg   MCHC 33.4 30.0 - 36.0 g/dL   RDW 14.0 11.5 - 15.5 %   Platelets 263 150 - 400 K/uL   Neutrophils Relative % 71 %   Neutro Abs 8.6 (H) 1.7 - 7.7 K/uL   Lymphocytes Relative 21 %   Lymphs Abs 2.6 0.7 - 4.0 K/uL   Monocytes Relative 7 %   Monocytes Absolute 0.8 0.1 - 1.0 K/uL   Eosinophils Relative 1 %   Eosinophils Absolute 0.1 0.0 - 0.7 K/uL   Basophils Relative 0 %   Basophils Absolute 0.0 0.0 - 0.1 K/uL  Sedimentation rate     Status: Abnormal   Collection Time: 05/31/17 12:00 PM  Result Value Ref Range   Sed Rate 40 (H) 0 - 22 mm/hr  CK     Status: None   Collection Time: 05/31/17 12:00 PM  Result Value Ref Range   Total CK 143 38 - 234 U/L  I-Stat beta hCG blood, ED     Status: None   Collection Time: 05/31/17 12:34 PM  Result Value Ref Range   I-stat hCG, quantitative <5.0 <5 mIU/mL   Comment 3            Comment:   GEST. AGE      CONC.  (mIU/mL)   <=1 WEEK        5 - 50     2 WEEKS       50 - 500     3 WEEKS       100 - 10,000     4 WEEKS     1,000 - 30,000        FEMALE AND NON-PREGNANT FEMALE:     LESS THAN 5 mIU/mL     Dg Elbow Complete Right (3+view)  Result Date: 05/31/2017 CLINICAL DATA:  Pain EXAM: RIGHT ELBOW - COMPLETE 3+ VIEW COMPARISON:  None. FINDINGS: Frontal, lateral, and bilateral oblique views were obtained. No fracture or dislocation. No joint effusion. Joint spaces appear normal. No erosive change. IMPRESSION: No fracture or joint effusion.  No evident arthropathy. Electronically Signed   By: Lowella Grip III M.D.   On: 05/31/2017 10:54   Dg Forearm Right  Result Date: 05/31/2017 CLINICAL DATA:  No known injury. Proximal right forearm pain for a few days. Patient is unable to straighten elbow. EXAM: RIGHT FOREARM - 2 VIEW COMPARISON:  None. FINDINGS: A posterior fat pad at the elbow is likely visible. No fracture,  erosion, or malalignment. IMPRESSION: 1. Suspected elbow joint effusion. 2. No osseous abnormality. Electronically Signed   By: Monte Fantasia M.D.   On: 05/31/2017 12:33    Review of Systems  Constitutional: Negative for weight loss.  HENT: Negative for ear discharge, ear pain, hearing loss and tinnitus.   Eyes: Negative for blurred vision, double vision, photophobia and pain.  Respiratory: Negative for cough, sputum production and shortness of breath.   Cardiovascular: Negative for chest pain.  Gastrointestinal: Negative for abdominal pain, nausea and vomiting.  Genitourinary: Negative for dysuria, flank pain, frequency and urgency.  Musculoskeletal: Positive for joint pain (Right elbow). Negative for back pain, falls, myalgias and neck pain.  Neurological: Positive for tingling (Hand, but now resolved). Negative for dizziness, sensory change, focal weakness, loss of consciousness and headaches.  Endo/Heme/Allergies: Does not bruise/bleed easily.  Psychiatric/Behavioral: Negative for depression, memory loss and substance abuse. The patient is not nervous/anxious.    Blood pressure 110/84, pulse 83, temperature 98 F (36.7 C), temperature source Oral, resp. rate 16, height 5' 4"  (1.626 m), weight (!) 137.4 kg (303 lb), last menstrual period 08/17/2016, SpO2 97 %. Physical Exam  Constitutional: She appears well-developed and well-nourished. No distress.  HENT:  Head: Normocephalic.  Eyes: Conjunctivae are normal. Right eye exhibits no discharge. Left eye exhibits no discharge. No scleral icterus.  Neck: Normal range of motion.  Cardiovascular: Normal rate and regular rhythm.   Respiratory: Effort normal. No respiratory distress.  Musculoskeletal:  Right shoulder, elbow, wrist, digits- no skin wounds, elbow diffuse severe TTP, no instability, AROM/PROM   Sens  Ax/R/M/U intact  Mot   Ax/ R/ PIN/ M/ AIN/ U intact  Rad 2+   Skin: She is not diaphoretic.    Assessment/Plan: Right  elbow pain -- Worrisome for septic joint. Continue NPO. Will check CRP and have IR attempt to aspirate for crystal analysis, GS, C&S. May need washout. Dr. Stann Mainland to assess later today.    Lisette Abu, PA-C Orthopedic Surgery [phone] 05/31/2017, 3:04 PM

## 2017-05-31 NOTE — ED Notes (Signed)
Pt returned to room. A&O

## 2017-05-31 NOTE — ED Notes (Signed)
PA reports Ativan is for IR - do not give now

## 2017-05-31 NOTE — ED Notes (Signed)
IR notified that pt was given Ativan and ready for transport.

## 2017-05-31 NOTE — ED Provider Notes (Signed)
[facility] EMERGENCY DEPARTMENT Provider Note   CSN: [id number] Arrival date & time: 05/31/17  0911     History   Chief Complaint Chief Complaint  Patient presents with  . Arm Pain    HPI Tiffany Buck is a 26 y.o. female who presents today for evaluation of 3 days of gradually worsening right elbow pain.  She denies any recent trauma.  She says that her pain has been gradually worsening, and is unrelieved by ibuprofen at home.  She denies any possibility of pregnancy.  She does endorse having a fever and sore throat approximately 1 week ago.  She says that that resolved.  No nausea or vomiting.  She reports that her pain feels like a throbbing dull ache but if she moves her elbow it become a sharp unbearable pain.      HPI  History reviewed. No pertinent past medical history.  There are no active problems to display for this patient.   History reviewed. No pertinent surgical history.  OB History    No data available       Home Medications    Prior to Admission medications   Medication Sig Start Date End Date Taking? Authorizing Provider  cyclobenzaprine (FLEXERIL) 5 MG tablet Take 1 tablet (5 mg total) by mouth at bedtime as needed for muscle spasms. 07/05/14   Rodolph Bong, MD  norethindrone-ethinyl estradiol (JUNEL FE,GILDESS FE,LOESTRIN FE) 1-20 MG-MCG tablet Take 1 tablet by mouth daily.    [provider]  oseltamivir (TAMIFLU) 75 MG capsule Take 1 capsule (75 mg total) by mouth every 12 (twelve) hours. 07/07/15   Charm Rings, MD  traMADol (ULTRAM) 50 MG tablet Take 1 tablet (50 mg total) by mouth every 6 (six) hours as needed. 07/05/14   Rodolph Bong, MD    Family History No family history on file.  Social History Social History  Substance Use Topics  . Smoking status: Never Smoker  . Smokeless tobacco: Never Used  . Alcohol use Yes     Comment: very rarely     Allergies   Sulfa antibiotics   Review of  Systems Review of Systems  Constitutional: Positive for fever (One week ago). Negative for chills.  HENT: Negative for ear pain and sore throat.   Eyes: Negative for pain and visual disturbance.  Respiratory: Negative for cough and shortness of breath.   Cardiovascular: Negative for chest pain and palpitations.  Gastrointestinal: Negative for abdominal pain and vomiting.  Genitourinary: Negative for dysuria, hematuria, pelvic pain and vaginal discharge.  Musculoskeletal: Positive for arthralgias. Negative for back pain, neck pain and neck stiffness.  Skin: Negative for color change and rash.  Neurological: Negative for seizures and syncope.  All other systems reviewed and are negative.    Physical Exam Updated Vital Signs BP 99/88 (BP Location: Left Wrist)   Pulse 80   Temp 98 F (36.7 C) (Oral)   Resp 16   Ht 5\' 4"  (1.626 m)   Wt (!) 137.4 kg (303 lb)   LMP 08/17/2016   SpO2 96%   BMI 52.01 kg/m   Physical Exam  Constitutional: She appears well-developed and well-nourished. No distress.  HENT:  Head: Normocephalic and atraumatic.  Eyes: Conjunctivae are normal. Right eye exhibits no discharge. Left eye exhibits no discharge. No scleral icterus.  Neck: Normal range of motion. Neck supple.  Patient has no neck pain, no pain with midline or paraspinal muscle palpation.  No pain with active range  of motion to neck.  Cardiovascular: Normal rate, regular rhythm and intact distal pulses.   No murmur heard. Right upper extremity is warm and well perfused.  2+ radial pulses bilaterally.  Risk capillary refill.  Pulmonary/Chest: Effort normal. No stridor. No respiratory distress.  Abdominal: She exhibits no distension.  Musculoskeletal: She exhibits no edema or deformity.  RUE: Right forearm is extremely tender to palpation, worse on radial side than the ulnar side.  She is unable to move her elbow secondary to severe pain.  She has active range of motion in her fingers and thumb.   Thumb movement does produce more pain and movement of her fingers.  There is no obvious crepitus or deformity.  Right forearm compartments appear soft and compressible, however exam limited secondary to body habitus.  Is mild tenderness to palpation over her right pectoral muscle, however this does not worsen her forearm pain.  Neurological: She is alert. She exhibits normal muscle tone.  Sensation intact to right upper extremity.    Skin: Skin is warm and dry. She is not diaphoretic.  On the right antecubital crease there is mild erythema without obvious induration, fluctuance, or drainage.  No obvious rashes or wounds to right upper extremity.  Psychiatric: She has a normal mood and affect. Her behavior is normal.  Nursing note and vitals reviewed.    ED Treatments / Results  Labs (all labs ordered are listed, but only abnormal results are displayed) Labs Reviewed  CBC WITH DIFFERENTIAL/PLATELET - Abnormal; Notable for the following:       Result Value   WBC 12.1 (*)    Neutro Abs 8.6 (*)    All other components within normal limits  SEDIMENTATION RATE - Abnormal; Notable for the following:    Sed Rate 40 (*)    All other components within normal limits  C-REACTIVE PROTEIN - Abnormal; Notable for the following:    CRP 4.6 (*)    All other components within normal limits  BODY FLUID CULTURE  BASIC METABOLIC PANEL  CK  SYNOVIAL CELL COUNT + DIFF, W/ CRYSTALS  I-STAT BETA HCG BLOOD, ED ([facility], [facility], [facility] ONLY)    EKG  EKG Interpretation  Date/Time:  Wednesday May 31 2017 09:38:01 EDT Ventricular Rate:  95 PR Interval:  156 QRS Duration: 88 QT Interval:  354 QTC Calculation: 444 R Axis:   -12 Text Interpretation:  Normal sinus rhythm Nonspecific T wave abnormality Confirmed by Cathren Laine ([id number]) on 05/31/2017 10:05:57 AM       Radiology Dg Elbow Complete Right (3+view)  Result Date: 05/31/2017 CLINICAL DATA:  Pain EXAM: RIGHT ELBOW - COMPLETE 3+ VIEW COMPARISON:   None. FINDINGS: Frontal, lateral, and bilateral oblique views were obtained. No fracture or dislocation. No joint effusion. Joint spaces appear normal. No erosive change. IMPRESSION: No fracture or joint effusion.  No evident arthropathy. Electronically Signed   By: Bretta Bang III M.D.   On: 05/31/2017 10:54   Dg Forearm Right  Result Date: 05/31/2017 CLINICAL DATA:  No known injury. Proximal right forearm pain for a few days. Patient is unable to straighten elbow. EXAM: RIGHT FOREARM - 2 VIEW COMPARISON:  None. FINDINGS: A posterior fat pad at the elbow is likely visible. No fracture, erosion, or malalignment. IMPRESSION: 1. Suspected elbow joint effusion. 2. No osseous abnormality. Electronically Signed   By: Marnee Spring M.D.   On: 05/31/2017 12:33    Procedures Procedures (including critical care time)  Medications Ordered in ED Medications  iopamidol (  ISOVUE-M) 41 % intrathecal injection (not administered)  acetaminophen (TYLENOL) tablet 1,000 mg (1,000 mg Oral Given 05/31/17 1053)  ibuprofen (ADVIL,MOTRIN) tablet 600 mg (600 mg Oral Given 05/31/17 1053)  morphine 4 MG/ML injection 4 mg (4 mg Intravenous Given 05/31/17 1213)  LORazepam (ATIVAN) injection 1 mg (1 mg Intravenous Given 05/31/17 1548)     Initial Impression / Assessment and Plan / ED Course  I have reviewed the triage vital signs and the nursing notes.  Pertinent labs & imaging results that were available during my care of the patient were reviewed by me and considered in my medical decision making (see chart for details).  Clinical Course as of May 31 2021  Wed May 31, 2017  1247 Patient re-evaluated.  She is more comfortable after morphine but still having pain.  Her pain is localized to her olecranon process with diffuse pain through arm.   [EH]  1412 Spoke with Charma IgoMichael Jeffery from orthopedics who agreed to come see the patient.   [EH]  1928 Called lab, as synovial count and crystal analysis was still  marked as needs to be collected.  Lab found fluid sample, and will start processing for analysis.  Discussed with Dr. Aundria Rudogers, and will await results for dispo.   [[place]]    Clinical Course User Index [EH] Cristina GongHammond, Elizabeth W, PA-C [[place]] Alveria Apleyaccavale, Sophia, PA-C   [facility] Ltd[facility]hel[facility] Imm presents for evaluation of right elbow and forearm pain that has worsened over the past few days.  She denies any trauma history.  She was unable to extend her right elbow secondary to pain.  She did have fevers a few days ago but they were subjective.  Labs were obtained showing an elevated sed rate to 40, white count to 12.1 and neutrophils to 8.6.  She is afebrile here in the ED.  Based on her physical exam and symptoms I am suspicious for septic arthritis.  I spoke with Charma IgoMichael Jeffery ortho PA who agreed to come and see the patient.  In discussions with him he decided to take the patient for IR guided aspiration of the joint.    At shift change [facility]av[facility] PA-C who will follow pending studies, re-evaulate and determine disposition.      Final Clinical Impressions(s) / ED Diagnoses   Final diagnoses:  Elbow pain, right    New Prescriptions New Prescriptions   No medications on file     Norman ClayHammond, Elizabeth W, PA-C 05/31/17 2056    Cathren LaineSteinl, Kevin, MD 06/01/17 [phone]

## 2017-05-31 NOTE — Discharge Instructions (Signed)
Take meloxicam once daily.  Do not take other anti-inflammatories at the same time (Advil, Motrin, naproxen, Aleve, ibuprofen).  You may supplement with Tylenol if you need further pain control. You may use the diclofenac gel as needed for pain. Use ice to help with pain and swelling. Follow-up with Dr. Aundria Rudogers on Monday or Tuesday of next week for evaluation of your elbow. Return to the emergency room if you develop persistent high fevers, significantly worsening pain, or any new or concerning symptoms.

## 2017-05-31 NOTE — ED Provider Notes (Signed)
Patient signed out to me by Dorise BullionE Hammond, PA-C.  Please see previous notes for further history.  In brief, patient presenting with right elbow pain.  Concern for possible septic joint.  Was evaluated by ortho, and IR synovial fluid analysis performed.  Results pending.  Results show 33,000 WBCs, increased CRP, no crystals, and no bacteria.  Discussed case with Dr. Aundria Rudogers from orthopedics, and patient to be treated as an inflammatory arthritis and follow-up with orthopedics.  Will change management if cultures grow out bacteria.  Per ortho recommendations, will give ketorolac IV, and discharged on Mobic.  Patient to follow-up with Ortho Monday or Tuesday of next week.  Discussed findings and plan with patient.  At this time, patient appears safe for discharge.  Strict return precautions given.  Patient states she understands and agrees to plan.     Tiffany Buck, Tiffany Kowal, PA-C 06/01/17 0107    [name], [name], MD 06/01/17 [phone]

## 2017-06-04 LAB — BODY FLUID CULTURE: Culture: NO GROWTH

## 2018-03-06 ENCOUNTER — Other Ambulatory Visit (HOSPITAL_COMMUNITY)
Admission: RE | Admit: 2018-03-06 | Discharge: 2018-03-06 | Disposition: A | Discharge status: Home / Self Care | Payer: 59 | Source: Ambulatory Visit | Attending: Nurse Practitioner | Admitting: Nurse Practitioner

## 2018-03-06 ENCOUNTER — Other Ambulatory Visit: Payer: Self-pay | Admitting: Nurse Practitioner

## 2018-03-06 DIAGNOSIS — Z01419 Encounter for gynecological examination (general) (routine) without abnormal findings: Secondary | ICD-10-CM | POA: Diagnosis present

## 2018-03-09 LAB — CYTOLOGY - PAP
CHLAMYDIA, DNA PROBE: NEGATIVE
DIAGNOSIS: NEGATIVE
Neisseria Gonorrhea: NEGATIVE

## 2019-10-16 IMAGING — DX DG FOREARM 2V*R*
2 series · 2 of 2 positions shown · non-contrast
Comparison: None.

CLINICAL DATA: No known injury. Proximal right forearm pain for a
few days. Patient is unable to straighten elbow.

EXAM:
RIGHT FOREARM - 2 VIEW

[x forearm ap right]
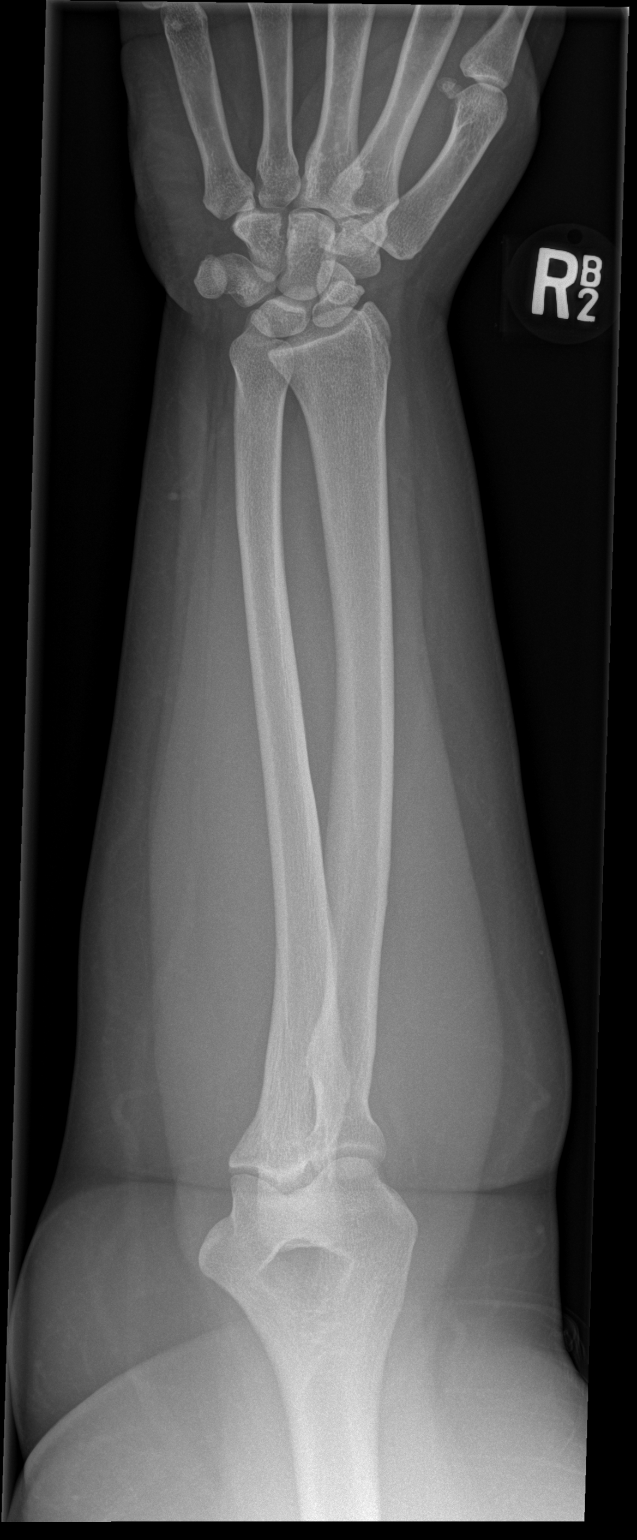

[x forearm lat right]
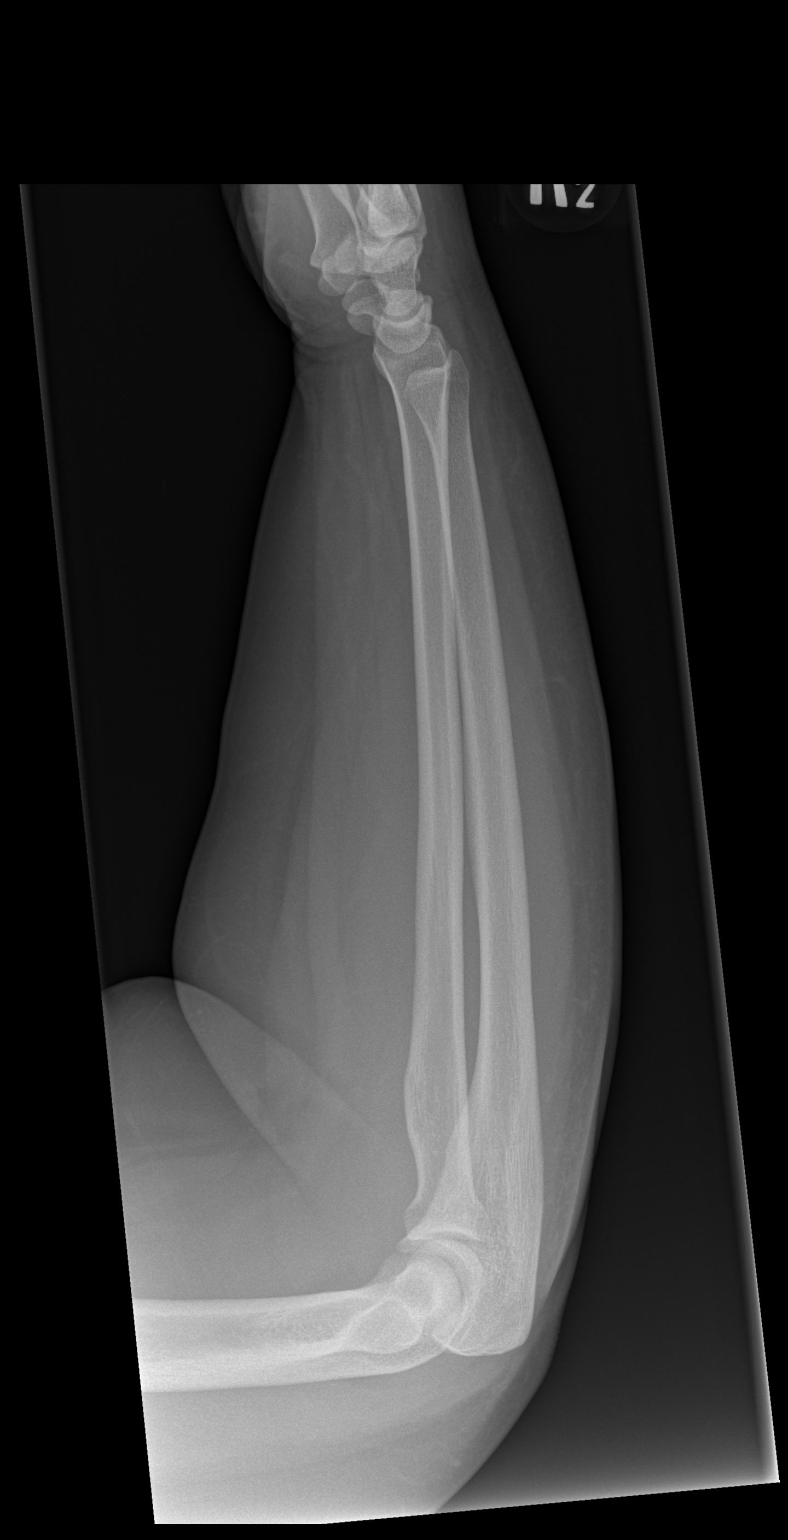

[2 of 2 positions shown; findings below may reference images not displayed]

FINDINGS: A posterior fat pad at the elbow is likely visible. No fracture,
erosion, or malalignment.
IMPRESSION: 1. Suspected elbow joint effusion.
2. No osseous abnormality.
